# Patient Record
Sex: Male | Born: 1940 | Race: White | Hispanic: No | Smoking: Former smoker
Health system: Southern US, Community
[De-identification: ages and names within clinical notes are randomized; demographics above are authoritative.]

## PROBLEM LIST (undated history)

## (undated) DIAGNOSIS — E039 Hypothyroidism, unspecified: Secondary | ICD-10-CM

## (undated) DIAGNOSIS — Z955 Presence of coronary angioplasty implant and graft: Secondary | ICD-10-CM

## (undated) DIAGNOSIS — C801 Malignant (primary) neoplasm, unspecified: Secondary | ICD-10-CM

## (undated) HISTORY — PX: CATARACT EXTRACTION: SUR2

## (undated) HISTORY — PX: TONSILLECTOMY: SUR1361

## (undated) HISTORY — PX: BACK SURGERY: SHX140

## (undated) HISTORY — PX: PROSTATE SURGERY: SHX751

## (undated) HISTORY — PX: CARDIAC CATHETERIZATION: SHX172

---

## 2010-10-10 DIAGNOSIS — E785 Hyperlipidemia, unspecified: Secondary | ICD-10-CM | POA: Insufficient documentation

## 2010-10-22 DIAGNOSIS — Z86010 Personal history of colonic polyps: Secondary | ICD-10-CM | POA: Insufficient documentation

## 2010-10-22 DIAGNOSIS — Z87891 Personal history of nicotine dependence: Secondary | ICD-10-CM | POA: Insufficient documentation

## 2010-10-23 DIAGNOSIS — Z8546 Personal history of malignant neoplasm of prostate: Secondary | ICD-10-CM | POA: Insufficient documentation

## 2011-09-01 DIAGNOSIS — Z823 Family history of stroke: Secondary | ICD-10-CM | POA: Insufficient documentation

## 2011-10-27 DIAGNOSIS — Z9889 Other specified postprocedural states: Secondary | ICD-10-CM | POA: Insufficient documentation

## 2012-10-26 DIAGNOSIS — H9190 Unspecified hearing loss, unspecified ear: Secondary | ICD-10-CM | POA: Insufficient documentation

## 2012-10-26 DIAGNOSIS — M542 Cervicalgia: Secondary | ICD-10-CM | POA: Insufficient documentation

## 2012-11-05 DIAGNOSIS — H6980 Other specified disorders of Eustachian tube, unspecified ear: Secondary | ICD-10-CM | POA: Insufficient documentation

## 2012-12-08 DIAGNOSIS — L309 Dermatitis, unspecified: Secondary | ICD-10-CM | POA: Insufficient documentation

## 2013-01-04 DIAGNOSIS — J42 Unspecified chronic bronchitis: Secondary | ICD-10-CM | POA: Insufficient documentation

## 2013-08-29 DIAGNOSIS — C61 Malignant neoplasm of prostate: Secondary | ICD-10-CM | POA: Insufficient documentation

## 2013-08-31 DIAGNOSIS — I209 Angina pectoris, unspecified: Secondary | ICD-10-CM | POA: Insufficient documentation

## 2013-09-27 DIAGNOSIS — G4733 Obstructive sleep apnea (adult) (pediatric): Secondary | ICD-10-CM | POA: Insufficient documentation

## 2013-12-05 DIAGNOSIS — R011 Cardiac murmur, unspecified: Secondary | ICD-10-CM | POA: Insufficient documentation

## 2016-01-11 DIAGNOSIS — E785 Hyperlipidemia, unspecified: Secondary | ICD-10-CM | POA: Insufficient documentation

## 2017-09-08 DIAGNOSIS — F419 Anxiety disorder, unspecified: Secondary | ICD-10-CM | POA: Insufficient documentation

## 2017-09-08 DIAGNOSIS — K219 Gastro-esophageal reflux disease without esophagitis: Secondary | ICD-10-CM | POA: Insufficient documentation

## 2017-09-08 DIAGNOSIS — G459 Transient cerebral ischemic attack, unspecified: Secondary | ICD-10-CM | POA: Insufficient documentation

## 2017-10-22 DIAGNOSIS — R110 Nausea: Secondary | ICD-10-CM | POA: Insufficient documentation

## 2017-10-22 DIAGNOSIS — R0609 Other forms of dyspnea: Secondary | ICD-10-CM | POA: Insufficient documentation

## 2017-10-22 DIAGNOSIS — R002 Palpitations: Secondary | ICD-10-CM | POA: Insufficient documentation

## 2021-10-14 NOTE — Progress Notes (Signed)
Sent message, via epic in basket, requesting orders in epic from surgeon.  

## 2021-10-24 NOTE — Progress Notes (Addendum)
COVID swab appointment: n/a  COVID Vaccine Completed: yes x3 Date COVID Vaccine completed: Has received booster: COVID vaccine manufacturer: Pfizer      Date of COVID positive in last 90 days: no  PCP - Geralyn Corwin Cardiologist - Dr Gerome [facility]  Chest x-ray - n/a EKG - 09/26/21 on chart Stress Test - many years ago per pt ECHO -  Cardiac Cath - 20 years has stent Pacemaker/ICD device last checked: no Spinal Cord Stimulator:n/a  Sleep Study - yes, negative CPAP -   Fasting Blood Sugar - n/a Checks Blood Sugar _____ times a day  Blood Thinner Instructions: Plavix, 1 week Aspirin Instructions:ASA 81, do not stop per cardiologist Last Dose:  Activity level: Can go up a flight of stairs and perform activities of daily living without stopping and without symptoms of chest pain or shortness of breath.  Anesthesia review:   Patient denies shortness of breath, fever, cough and chest pain at PAT appointment   Patient verbalized understanding of instructions that were given to them at the PAT appointment. Patient was also instructed that they will need to review over the PAT instructions again at home before surgery.

## 2021-10-24 NOTE — Patient Instructions (Addendum)
DUE TO COVID-19 ONLY ONE VISITOR IS ALLOWED TO COME WITH YOU AND STAY IN THE WAITING ROOM ONLY DURING PRE OP AND PROCEDURE.   **NO VISITORS ARE ALLOWED IN THE SHORT STAY AREA OR RECOVERY ROOM!!**       Your procedure is scheduled on: 11/06/21   Report to [facility] Main Entrance    Report to admitting at 5:45 AM   Call this number if you have problems the morning of surgery [phone]   Do not eat food :After Midnight.   May have liquids until 5:30 AM day of surgery  CLEAR LIQUID DIET  Foods Allowed                                                                     Foods Excluded  Water, Black Coffee and tea (no milk or creamer)           liquids that you cannot  Plain Jell-O in any flavor  (No red)                                    see through such as: Fruit ices (not with fruit pulp)                                            milk, soups, orange juice              Iced Popsicles (No red)                                               All solid food                                   Apple juices Sports drinks like Gatorade (No red) Lightly seasoned clear broth or consume(fat free) Sugar     The day of surgery:  Drink ONE (1) Pre-Surgery clear ensure by 5:30 am the morning of surgery. Drink in one sitting. Do not sip.  This drink was given to you during your hospital  pre-op appointment visit. Nothing else to drink after completing the  Pre-Surgery Clear Ensure.          If you have questions, please contact your surgeon's office.     Oral Hygiene is also important to reduce your risk of infection.                                    Remember - BRUSH YOUR TEETH THE MORNING OF SURGERY WITH YOUR REGULAR TOOTHPASTE   Take these medicines the morning of surgery with A SIP OF WATER: Amlodipine, Synthroid, Omeprazole, Pravastatin   DO NOT TAKE ANY ORAL DIABETIC MEDICATIONS DAY OF YOUR SURGERY  You may not have any metal on your body  including jewelry, and body piercing             Do not wear lotions, powders, cologne, or deodorant              Men may shave face and neck.   Do not bring valuables to the hospital. [place].    Patients discharged on the day of surgery will not be allowed to drive home.   Special Instructions: Bring a copy of your healthcare power of attorney and living will documents         the day of surgery if you haven't scanned them before.              Please read over the following fact sheets you were given: IF YOU HAVE QUESTIONS ABOUT YOUR PRE-OP INSTRUCTIONS PLEASE CALL [facility] - Preparing for Surgery Before surgery, you can play an important role.  Because skin is not sterile, your skin needs to be as free of germs as possible.  You can reduce the number of germs on your skin by washing with CHG (chlorahexidine gluconate) soap before surgery.  CHG is an antiseptic cleaner which kills germs and bonds with the skin to continue killing germs even after washing. Please DO NOT use if you have an allergy to CHG or antibacterial soaps.  If your skin becomes reddened/irritated stop using the CHG and inform your nurse when you arrive at Short Stay. Do not shave (including legs and underarms) for at least 48 hours prior to the first CHG shower.  You may shave your face/neck.  Please follow these instructions carefully:  1.  Shower with CHG Soap the night before surgery and the  morning of surgery.  2.  If you choose to wash your hair, wash your hair first as usual with your normal  shampoo.  3.  After you shampoo, rinse your hair and body thoroughly to remove the shampoo.                             4.  Use CHG as you would any other liquid soap.  You can apply chg directly to the skin and wash.  Gently with a scrungie or clean washcloth.  5.  Apply the CHG Soap to your body ONLY FROM THE NECK DOWN.   Do   not use on face/  open                           Wound or open sores. Avoid contact with eyes, ears mouth and   genitals (private parts).                       Wash face,  Genitals (private parts) with your normal soap.             6.  Wash thoroughly, paying special attention to the area where your    surgery  will be performed.  7.  Thoroughly rinse your body with warm water from the neck down.  8.  DO NOT shower/wash with your normal soap after using and rinsing off the CHG Soap.  9.  Pat yourself dry with a clean towel.            10.  Wear clean pajamas.            11.  Place clean sheets on your bed the night of your first shower and do not  sleep with pets. Day of Surgery : Do not apply any lotions/deodorants the morning of surgery.  Please wear clean clothes to the hospital/surgery center.  FAILURE TO FOLLOW THESE INSTRUCTIONS MAY RESULT IN THE CANCELLATION OF YOUR SURGERY  PATIENT SIGNATURE_________________________________  NURSE SIGNATURE__________________________________  ________________________________________________________________________   Robert Horne  An incentive spirometer is a tool that can help keep your lungs clear and active. This tool measures how well you are filling your lungs with each breath. Taking long deep breaths may help reverse or decrease the chance of developing breathing (pulmonary) problems (especially infection) following: A long period of time when you are unable to move or be active. BEFORE THE PROCEDURE  If the spirometer includes an indicator to show your best effort, your nurse or respiratory therapist will set it to a desired goal. If possible, sit up straight or lean slightly forward. Try not to slouch. Hold the incentive spirometer in an upright position. INSTRUCTIONS FOR USE  Sit on the edge of your bed if possible, or sit up as far as you can in bed or on a chair. Hold the incentive spirometer in an upright position. Breathe out  normally. Place the mouthpiece in your mouth and seal your lips tightly around it. Breathe in slowly and as deeply as possible, raising the piston or the ball toward the top of the column. Hold your breath for 3-5 seconds or for as long as possible. Allow the piston or ball to fall to the bottom of the column. Remove the mouthpiece from your mouth and breathe out normally. Rest for a few seconds and repeat Steps 1 through 7 at least 10 times every 1-2 hours when you are awake. Take your time and take a few normal breaths between deep breaths. The spirometer may include an indicator to show your best effort. Use the indicator as a goal to work toward during each repetition. After each set of 10 deep breaths, practice coughing to be sure your lungs are clear. If you have an incision (the cut made at the time of surgery), support your incision when coughing by placing a pillow or rolled up towels firmly against it. Once you are able to get out of bed, walk around indoors and cough well. You may stop using the incentive spirometer when instructed by your caregiver.  RISKS AND COMPLICATIONS Take your time so you do not get dizzy or light-headed. If you are in pain, you may need to take or ask for pain medication before doing incentive spirometry. It is harder to take a deep breath if you are having pain. AFTER USE Rest and breathe slowly and easily. It can be helpful to keep track of a log of your progress. Your caregiver can provide you with a simple table to help with this. If you are using the spirometer at home, follow these instructions: [place] IF:  You are having difficultly using the spirometer. You have trouble using the spirometer as often as instructed. Your pain medication is not giving enough relief while using the spirometer. You develop fever of 100.5 F (38.1 C) or higher. SEEK IMMEDIATE MEDICAL CARE IF:  You cough up bloody sputum that had  not been present before. You  develop fever of 102 F (38.9 C) or greater. You develop worsening pain at or near the incision site. MAKE SURE YOU:  Understand these instructions. Will watch your condition. Will get help right away if you are not doing well or get worse. Document Released: 03/23/2007 Document Revised: 02/02/2012 Document Reviewed: 05/24/2007 ExitCare Patient Information 2014 [place].   ________________________________________________________________________  [facility]- Preparing for Total Shoulder Arthroplasty    Before surgery, you can play an important role. Because skin is not sterile, your skin needs to be as free of germs as possible. You can reduce the number of germs on your skin by using the following products. Benzoyl Peroxide Gel Reduces the number of germs present on the skin Applied twice a day to shoulder area starting two days before surgery    ==================================================================  Please follow these instructions carefully:  BENZOYL PEROXIDE 5% GEL  Please do not use if you have an allergy to benzoyl peroxide.   If your skin becomes reddened/irritated stop using the benzoyl peroxide.  Starting two days before surgery, apply as follows: Apply benzoyl peroxide in the morning and at night. Apply after taking a shower. If you are not taking a shower clean entire shoulder front, back, and side along with the armpit with a clean wet washcloth.  Place a quarter-sized dollop on your shoulder and rub in thoroughly, making sure to cover the front, back, and side of your shoulder, along with the armpit.   2 days before ____ AM   ____ PM              1 day before ____ AM   ____ PM                         Do this twice a day for two days.  (Last application is the night before surgery, AFTER using the CHG soap as described below).  Do NOT apply benzoyl peroxide gel on the day of surgery.

## 2021-10-28 ENCOUNTER — Encounter (HOSPITAL_COMMUNITY): Payer: Self-pay

## 2021-10-28 ENCOUNTER — Encounter (HOSPITAL_COMMUNITY)
Admission: RE | Admit: 2021-10-28 | Discharge: 2021-10-28 | Disposition: A | Discharge status: Home / Self Care | Payer: Medicare Other | Source: Ambulatory Visit | Attending: Orthopaedic Surgery | Admitting: Orthopaedic Surgery

## 2021-10-28 ENCOUNTER — Other Ambulatory Visit: Payer: Self-pay

## 2021-10-28 VITALS — BP 133/66 | HR 85 | Temp 97.8°F | Resp 20 | Ht 71.0 in

## 2021-10-28 DIAGNOSIS — Z01812 Encounter for preprocedural laboratory examination: Secondary | ICD-10-CM | POA: Diagnosis present

## 2021-10-28 DIAGNOSIS — Z01818 Encounter for other preprocedural examination: Secondary | ICD-10-CM

## 2021-10-28 DIAGNOSIS — I2510 Atherosclerotic heart disease of native coronary artery without angina pectoris: Secondary | ICD-10-CM | POA: Insufficient documentation

## 2021-10-28 HISTORY — DX: Presence of coronary angioplasty implant and graft: Z95.5

## 2021-10-28 HISTORY — DX: Hypothyroidism, unspecified: E03.9

## 2021-10-28 HISTORY — DX: Malignant (primary) neoplasm, unspecified: C80.1

## 2021-10-28 LAB — BASIC METABOLIC PANEL
Anion gap: 6 (ref 5–15)
BUN: 13 mg/dL (ref 8–23)
CO2: 26 mmol/L (ref 22–32)
Calcium: 9.2 mg/dL (ref 8.9–10.3)
Chloride: 107 mmol/L (ref 98–111)
Creatinine, Ser: 0.89 mg/dL (ref 0.61–1.24)
GFR, Estimated: >60 mL/min (ref >60)
Glucose, Bld: 109 mg/dL — ABNORMAL HIGH (ref 70–99)
Potassium: 4.4 mmol/L (ref 3.5–5.1)
Sodium: 139 mmol/L (ref 135–145)

## 2021-10-28 LAB — CBC
HCT: 47.8 % (ref 39.0–52.0)
Hemoglobin: 15.7 g/dL (ref 13.0–17.0)
MCH: 30.8 pg (ref 26.0–34.0)
MCHC: 32.8 g/dL (ref 30.0–36.0)
MCV: 93.9 fL (ref 80.0–100.0)
Platelets: 282 10*3/uL (ref 150–400)
RBC: 5.09 MIL/uL (ref 4.22–5.81)
RDW: 12.2 % (ref 11.5–15.5)
WBC: 9.5 10*3/uL (ref 4.0–10.5)
nRBC: 0 % (ref 0.0–0.2)

## 2021-10-28 LAB — SURGICAL PCR SCREEN
MRSA, PCR: NEGATIVE
Staphylococcus aureus: NEGATIVE

## 2021-11-05 ENCOUNTER — Encounter (HOSPITAL_COMMUNITY): Payer: Self-pay | Admitting: Orthopaedic Surgery

## 2021-11-05 NOTE — H&P (Signed)
PREOPERATIVE H&P  Chief Complaint: djd right shoulder  HPI: Robert Horne is a 80 y.o. male who is scheduled for Procedure(s): [place].   Patient has a past medical history significant for prostate cancer in 2002, hypothyroidism, coronary artery stent.   Patient is an 80 year-old who previously had no reported history of pain in the shoulder who had an injury four weeks ago.  He picked up something and heard a pop.  He was seen by an orthopedist in [place]. He is right hand dominant at baseline.  He has not been able to raise his arm.  He has been trying non-operative measures, but has not made much progress.  He has a history of prostate cancer and is on Plavix and 81 mg Aspirin.    His symptoms are rated as moderate to severe, and have been worsening.  This is significantly impairing activities of daily living.    Please see clinic note for further details on this patient's care.    He has elected for surgical management.   Past Medical History:  Diagnosis Date   Cancer [facility])    prostate, 2002   H/O heart artery stent    Hypothyroidism    Past Surgical History:  Procedure Laterality Date   BACK SURGERY     CARDIAC CATHETERIZATION     CATARACT EXTRACTION Bilateral    PROSTATE SURGERY     TONSILLECTOMY     Social History   Socioeconomic History   Marital status: Married    Spouse name: Not on file   Number of children: Not on file   Years of education: Not on file   Highest education level: Not on file  Occupational History   Not on file  Tobacco Use   Smoking status: Former    Types: Cigarettes    Quit date: 10/24/1970    Years since quitting: 51.0   Smokeless tobacco: Never  Vaping Use   Vaping Use: Never used  Substance and Sexual Activity   Alcohol use: Never   Drug use: Never   Sexual activity: Not on file  Other Topics Concern   Not on file  Social History Narrative   Not on file   Social Determinants of Health   Financial  Resource Strain: Not on file  Food Insecurity: Not on file  Transportation Needs: Not on file  Physical Activity: Not on file  Stress: Not on file  Social Connections: Not on file   No family history on file. No Known Allergies Prior to Admission medications   Medication Sig Start Date End Date Taking? Authorizing Provider  amLODipine (NORVASC) 2.5 MG tablet Take 2.5 mg by mouth daily.   Yes [provider]  aspirin EC 81 MG tablet Take 81 mg by mouth daily. Swallow whole.   Yes [provider]  Cholecalciferol (VITAMIN D) 50 MCG (2000 UT) tablet Take 2,000 Units by mouth daily.   Yes [provider]  clopidogrel (PLAVIX) 75 MG tablet Take 75 mg by mouth daily.   Yes [provider]  levothyroxine (SYNTHROID) 50 MCG tablet Take 50 mcg by mouth daily before breakfast.   Yes [provider]  mirtazapine (REMERON) 45 MG tablet Take 45 mg by mouth at bedtime.   Yes [provider]  omeprazole (PRILOSEC) 20 MG capsule Take 20 mg by mouth daily.   Yes [provider]  pravastatin (PRAVACHOL) 10 MG tablet Take 10 mg by mouth daily.   Yes [provider]  thyroid (ARMOUR) 60 MG tablet Take 60 mg by mouth daily before breakfast.   Yes [provider]  triamcinolone cream (KENALOG) 0.1 % Apply 1 application topically 2 (two) times daily as needed (eczema).   Yes [provider]    ROS: All other systems have been reviewed and were otherwise negative with the exception of those mentioned in the HPI and as above.  Physical Exam: General: Alert, no acute distress Cardiovascular: No pedal edema Respiratory: No cyanosis, no use of accessory musculature GI: No organomegaly, abdomen is soft and non-tender Skin: No lesions in the area of chief complaint Neurologic: Sensation intact distally Psychiatric: Patient is competent for consent with normal mood and affect Lymphatic: No axillary or cervical  lymphadenopathy  MUSCULOSKELETAL:  Right shoulder: Active forward elevation about 20 degrees.  He has pseudo paralysis.  He does not really have any functional shoulder testing due to his obvious pain.  Passive motion to 170 degrees.    Imaging: MRI demonstrates a massive cuff tear, three tendon.  Assessment: djd right shoulder  Plan: Plan for Procedure(s): REVERSE SHOULDER ARTHROPLASTY  The risks benefits and alternatives were discussed with the patient including but not limited to the risks of nonoperative treatment, versus surgical intervention including infection, bleeding, nerve injury,  blood clots, cardiopulmonary complications, morbidity, mortality, among others, and they were willing to proceed.   We additionally specifically discussed risks of axillary nerve injury, infection, periprosthetic fracture, continued pain and longevity of implants prior to beginning procedure.    Patient will be closely monitored in PACU for medical stabilization and pain control. If found stable in PACU, patient may be discharged home with outpatient follow-up. If any concerns regarding patient's stabilization patient will be admitted for observation after surgery. The patient is planning to be discharged home with outpatient PT.   The patient acknowledged the explanation, agreed to proceed with the plan and consent was signed.   He received operative clearance from his PCP, Dr. Alfonse Ras, and cardiologist, Carrington Clamp PA.  Operative Plan: Right reverse total shoulder arthroplasty Discharge Medications: Tylenol, oxycodone, zofran DVT Prophylaxis: resume plavix and aspirin Physical Therapy: outpatient PT ([facility]) Special Discharge needs: [place]. [place], PA-C  11/05/2021 7:03 AM

## 2021-11-05 NOTE — Anesthesia Preprocedure Evaluation (Addendum)
Anesthesia Evaluation  Patient identified by MRN, date of birth, ID band Patient awake    Reviewed: Allergy & Precautions, NPO status , Patient's Chart, lab work & pertinent test results, reviewed documented beta blocker date and time   Airway Mallampati: III  TM Distance: >3 FB Neck ROM: Full    Dental no notable dental hx. (+) Teeth Intact, Dental Advisory Given   Pulmonary former smoker,    Pulmonary exam normal breath sounds clear to auscultation       Cardiovascular Exercise Tolerance: Good + CAD and + Cardiac Stents  Normal cardiovascular exam Rhythm:Regular Rate:Normal  Hx/o stent placement 20 years ago, asymptomatic since that time   Neuro/Psych negative neurological ROS  negative psych ROS   GI/Hepatic negative GI ROS, Neg liver ROS,   Endo/Other  Hypothyroidism Hyperlipidemia  Renal/GU negative Renal ROS   Hx/o prostate Ca S/P radioactive seed placement 20 years ago negative genitourinary   Musculoskeletal  (+) Arthritis , Osteoarthritis,  DJD right shoulder   Abdominal   Peds  Hematology Plavix therapy- last dose 1 week ago   Anesthesia Other Findings   Reproductive/Obstetrics                           Anesthesia Physical Anesthesia Plan  ASA: 3  Anesthesia Plan: General   Post-op Pain Management: Regional block   Induction: Intravenous  PONV Risk Score and Plan: 3 and Treatment may vary due to age or medical condition, Ondansetron and Dexamethasone  Airway Management Planned: Oral ETT  Additional Equipment:   Intra-op Plan:   Post-operative Plan: Extubation in OR  Informed Consent: I have reviewed the patients History and Physical, chart, labs and discussed the procedure including the risks, benefits and alternatives for the proposed anesthesia with the patient or authorized representative who has indicated his/her understanding and acceptance.     Dental  advisory given  Plan Discussed with: CRNA and Anesthesiologist  Anesthesia Plan Comments:        Anesthesia Quick Evaluation

## 2021-11-06 ENCOUNTER — Ambulatory Visit (HOSPITAL_COMMUNITY): Payer: Medicare Other

## 2021-11-06 ENCOUNTER — Encounter (HOSPITAL_COMMUNITY)
Admission: RE | Disposition: A | Discharge status: Home / Self Care | Payer: Self-pay | Source: Home / Self Care | Attending: Orthopaedic Surgery

## 2021-11-06 ENCOUNTER — Ambulatory Visit (HOSPITAL_COMMUNITY): Payer: Medicare Other | Admitting: Anesthesiology

## 2021-11-06 ENCOUNTER — Ambulatory Visit (HOSPITAL_COMMUNITY)
Admission: RE | Admit: 2021-11-06 | Discharge: 2021-11-06 | Disposition: A | Discharge status: Home / Self Care | Payer: Medicare Other | Attending: Orthopaedic Surgery | Admitting: Orthopaedic Surgery

## 2021-11-06 ENCOUNTER — Encounter (HOSPITAL_COMMUNITY): Payer: Self-pay | Admitting: Orthopaedic Surgery

## 2021-11-06 DIAGNOSIS — Z87891 Personal history of nicotine dependence: Secondary | ICD-10-CM | POA: Insufficient documentation

## 2021-11-06 DIAGNOSIS — Z955 Presence of coronary angioplasty implant and graft: Secondary | ICD-10-CM | POA: Diagnosis not present

## 2021-11-06 DIAGNOSIS — Z09 Encounter for follow-up examination after completed treatment for conditions other than malignant neoplasm: Secondary | ICD-10-CM

## 2021-11-06 DIAGNOSIS — M25711 Osteophyte, right shoulder: Secondary | ICD-10-CM | POA: Diagnosis not present

## 2021-11-06 DIAGNOSIS — M75101 Unspecified rotator cuff tear or rupture of right shoulder, not specified as traumatic: Secondary | ICD-10-CM | POA: Diagnosis not present

## 2021-11-06 DIAGNOSIS — Z7982 Long term (current) use of aspirin: Secondary | ICD-10-CM | POA: Insufficient documentation

## 2021-11-06 DIAGNOSIS — E785 Hyperlipidemia, unspecified: Secondary | ICD-10-CM | POA: Insufficient documentation

## 2021-11-06 DIAGNOSIS — I2510 Atherosclerotic heart disease of native coronary artery without angina pectoris: Secondary | ICD-10-CM | POA: Insufficient documentation

## 2021-11-06 DIAGNOSIS — E039 Hypothyroidism, unspecified: Secondary | ICD-10-CM | POA: Insufficient documentation

## 2021-11-06 DIAGNOSIS — Z8546 Personal history of malignant neoplasm of prostate: Secondary | ICD-10-CM | POA: Diagnosis not present

## 2021-11-06 DIAGNOSIS — M19011 Primary osteoarthritis, right shoulder: Secondary | ICD-10-CM | POA: Insufficient documentation

## 2021-11-06 HISTORY — PX: REVERSE SHOULDER ARTHROPLASTY: SHX5054

## 2021-11-06 SURGERY — ARTHROPLASTY, SHOULDER, TOTAL, REVERSE
Anesthesia: General | Site: Shoulder | Laterality: Right

## 2021-11-06 MED ORDER — DEXAMETHASONE SODIUM PHOSPHATE 10 MG/ML IJ SOLN
INTRAMUSCULAR | Status: DC | PRN
  Administered 2021-11-06: 8 mg via INTRAVENOUS

## 2021-11-06 MED ORDER — LACTATED RINGERS IV SOLN
INTRAVENOUS | Status: DC
  Administered 2021-11-06: 11:00:00 1000 mL via INTRAVENOUS

## 2021-11-06 MED ORDER — PROPOFOL 10 MG/ML IV BOLUS
INTRAVENOUS | Status: DC | PRN
  Administered 2021-11-06: 110 mg via INTRAVENOUS

## 2021-11-06 MED ORDER — OXYCODONE HCL 5 MG PO TABS: 5.0000 mg | ORAL_TABLET | Freq: Once | ORAL | Status: DC | PRN

## 2021-11-06 MED ORDER — ROCURONIUM BROMIDE 10 MG/ML (PF) SYRINGE
PREFILLED_SYRINGE | INTRAVENOUS | Status: DC | PRN
  Administered 2021-11-06: 60 mg via INTRAVENOUS

## 2021-11-06 MED ORDER — VANCOMYCIN HCL 1000 MG IV SOLR
INTRAVENOUS | Status: AC
  Filled 2021-11-06: qty 20

## 2021-11-06 MED ORDER — EPHEDRINE 5 MG/ML INJ
INTRAVENOUS | Status: AC
  Filled 2021-11-06: qty 5

## 2021-11-06 MED ORDER — ONDANSETRON HCL 4 MG PO TABS: 4.0000 mg | ORAL_TABLET | Freq: Three times a day (TID) | ORAL | 0 refills | Status: DC | PRN

## 2021-11-06 MED ORDER — BUPIVACAINE LIPOSOME 1.3 % IJ SUSP
INTRAMUSCULAR | Status: DC | PRN
  Administered 2021-11-06: 10 mL via PERINEURAL

## 2021-11-06 MED ORDER — PROPOFOL 10 MG/ML IV BOLUS
INTRAVENOUS | Status: AC
  Filled 2021-11-06: qty 20

## 2021-11-06 MED ORDER — FENTANYL CITRATE (PF) 100 MCG/2ML IJ SOLN
INTRAMUSCULAR | Status: AC
  Filled 2021-11-06: qty 2

## 2021-11-06 MED ORDER — PHENYLEPHRINE HCL (PRESSORS) 10 MG/ML IV SOLN
INTRAVENOUS | Status: AC
  Filled 2021-11-06: qty 1

## 2021-11-06 MED ORDER — ORAL CARE MOUTH RINSE
15.0000 mL | Freq: Once | OROMUCOSAL | Status: AC
  Administered 2021-11-06: 07:00:00 15 mL via OROMUCOSAL

## 2021-11-06 MED ORDER — EPHEDRINE SULFATE-NACL 50-0.9 MG/10ML-% IV SOSY
PREFILLED_SYRINGE | INTRAVENOUS | Status: DC | PRN
  Administered 2021-11-06: 5 mg via INTRAVENOUS

## 2021-11-06 MED ORDER — LIDOCAINE 2% (20 MG/ML) 5 ML SYRINGE
INTRAMUSCULAR | Status: DC | PRN
  Administered 2021-11-06: 60 mg via INTRAVENOUS

## 2021-11-06 MED ORDER — 0.9 % SODIUM CHLORIDE (POUR BTL) OPTIME
TOPICAL | Status: DC | PRN
  Administered 2021-11-06: 09:00:00 1000 mL

## 2021-11-06 MED ORDER — OXYCODONE HCL 5 MG PO TABS: ORAL_TABLET | ORAL | 0 refills | Status: AC

## 2021-11-06 MED ORDER — FENTANYL CITRATE (PF) 100 MCG/2ML IJ SOLN
INTRAMUSCULAR | Status: DC | PRN
  Administered 2021-11-06 (×2): 50 ug via INTRAVENOUS

## 2021-11-06 MED ORDER — DEXAMETHASONE SODIUM PHOSPHATE 10 MG/ML IJ SOLN
INTRAMUSCULAR | Status: AC
  Filled 2021-11-06: qty 1

## 2021-11-06 MED ORDER — CEFAZOLIN SODIUM-DEXTROSE 2-4 GM/100ML-% IV SOLN
2.0000 g | INTRAVENOUS | Status: AC
  Administered 2021-11-06: 09:00:00 2 g via INTRAVENOUS

## 2021-11-06 MED ORDER — ACETAMINOPHEN 500 MG PO TABS: 1000.0000 mg | ORAL_TABLET | Freq: Three times a day (TID) | ORAL | 0 refills | Status: DC

## 2021-11-06 MED ORDER — ONDANSETRON HCL 4 MG/2ML IJ SOLN: 4.0000 mg | Freq: Once | INTRAMUSCULAR | Status: DC | PRN

## 2021-11-06 MED ORDER — CHLORHEXIDINE GLUCONATE 0.12 % MT SOLN: 15.0000 mL | Freq: Once | OROMUCOSAL | Status: AC

## 2021-11-06 MED ORDER — LACTATED RINGERS IV BOLUS
500.0000 mL | Freq: Once | INTRAVENOUS | Status: AC
  Administered 2021-11-06: 10:00:00 500 mL via INTRAVENOUS

## 2021-11-06 MED ORDER — ACETAMINOPHEN 500 MG PO TABS
1000.0000 mg | ORAL_TABLET | Freq: Once | ORAL | Status: AC
  Administered 2021-11-06: 07:00:00 1000 mg via ORAL
  Filled 2021-11-06: qty 2

## 2021-11-06 MED ORDER — OXYCODONE HCL 5 MG/5ML PO SOLN: 5.0000 mg | Freq: Once | ORAL | Status: DC | PRN

## 2021-11-06 MED ORDER — LACTATED RINGERS IV BOLUS
250.0000 mL | Freq: Once | INTRAVENOUS | Status: AC
  Administered 2021-11-06: 11:00:00 250 mL via INTRAVENOUS

## 2021-11-06 MED ORDER — ONDANSETRON HCL 4 MG PO TABS: 4.0000 mg | ORAL_TABLET | Freq: Three times a day (TID) | ORAL | 0 refills | Status: AC | PRN

## 2021-11-06 MED ORDER — SODIUM CHLORIDE 0.9 % IR SOLN
Status: DC | PRN
  Administered 2021-11-06: 1000 mL

## 2021-11-06 MED ORDER — OXYCODONE HCL 5 MG PO TABS: ORAL_TABLET | ORAL | 0 refills | Status: DC

## 2021-11-06 MED ORDER — SUGAMMADEX SODIUM 200 MG/2ML IV SOLN
INTRAVENOUS | Status: DC | PRN
  Administered 2021-11-06: 200 mg via INTRAVENOUS

## 2021-11-06 MED ORDER — STERILE WATER FOR IRRIGATION IR SOLN
Status: DC | PRN
  Administered 2021-11-06: 2000 mL

## 2021-11-06 MED ORDER — ONDANSETRON HCL 4 MG/2ML IJ SOLN
INTRAMUSCULAR | Status: DC | PRN
  Administered 2021-11-06: 4 mg via INTRAVENOUS

## 2021-11-06 MED ORDER — PHENYLEPHRINE HCL-NACL 20-0.9 MG/250ML-% IV SOLN
INTRAVENOUS | Status: DC | PRN
  Administered 2021-11-06: 25 ug/min via INTRAVENOUS

## 2021-11-06 MED ORDER — VANCOMYCIN HCL 1 G IV SOLR
INTRAVENOUS | Status: DC | PRN
  Administered 2021-11-06: 1000 mg

## 2021-11-06 MED ORDER — FENTANYL CITRATE PF 50 MCG/ML IJ SOSY: 25.0000 ug | PREFILLED_SYRINGE | INTRAMUSCULAR | Status: DC | PRN

## 2021-11-06 MED ORDER — ACETAMINOPHEN 500 MG PO TABS: 1000.0000 mg | ORAL_TABLET | Freq: Three times a day (TID) | ORAL | 0 refills | Status: AC

## 2021-11-06 MED ORDER — BUPIVACAINE HCL (PF) 0.5 % IJ SOLN
INTRAMUSCULAR | Status: DC | PRN
  Administered 2021-11-06: 20 mL via PERINEURAL

## 2021-11-06 MED ORDER — ONDANSETRON HCL 4 MG/2ML IJ SOLN
INTRAMUSCULAR | Status: AC
  Filled 2021-11-06: qty 2

## 2021-11-06 MED ORDER — MIDAZOLAM HCL 2 MG/2ML IJ SOLN
INTRAMUSCULAR | Status: AC
  Filled 2021-11-06: qty 2

## 2021-11-06 MED ORDER — PHENYLEPHRINE 40 MCG/ML (10ML) SYRINGE FOR IV PUSH (FOR BLOOD PRESSURE SUPPORT)
PREFILLED_SYRINGE | INTRAVENOUS | Status: DC | PRN
  Administered 2021-11-06 (×2): 120 ug via INTRAVENOUS

## 2021-11-06 MED ORDER — CEFAZOLIN SODIUM-DEXTROSE 2-4 GM/100ML-% IV SOLN
INTRAVENOUS | Status: AC
  Filled 2021-11-06: qty 100

## 2021-11-06 MED ORDER — LIDOCAINE HCL (PF) 2 % IJ SOLN
INTRAMUSCULAR | Status: AC
  Filled 2021-11-06: qty 5

## 2021-11-06 MED ORDER — TRANEXAMIC ACID-NACL 1000-0.7 MG/100ML-% IV SOLN
1000.0000 mg | INTRAVENOUS | Status: AC
  Administered 2021-11-06: 09:00:00 1000 mg via INTRAVENOUS
  Filled 2021-11-06: qty 100

## 2021-11-06 SURGICAL SUPPLY — 67 items
BAG COUNTER SPONGE SURGICOUNT (BAG) ×2 IMPLANT
BIT DRILL 3.2 PERIPHERAL SCREW (BIT) ×1 IMPLANT
BLADE SAW SAG 73X25 THK (BLADE) ×1
BLADE SAW SGTL 73X25 THK (BLADE) ×1 IMPLANT
CHLORAPREP W/TINT 26 (MISCELLANEOUS) ×4 IMPLANT
CLSR STERI-STRIP ANTIMIC 1/2X4 (GAUZE/BANDAGES/DRESSINGS) ×2 IMPLANT
COOLER ICEMAN CLASSIC (MISCELLANEOUS) ×1 IMPLANT
COVER BACK TABLE 60X90IN (DRAPES) IMPLANT
COVER SURGICAL LIGHT HANDLE (MISCELLANEOUS) ×2 IMPLANT
DRAPE C-ARM 42X120 X-RAY (DRAPES) IMPLANT
DRAPE INCISE IOBAN 66X45 STRL (DRAPES) ×2 IMPLANT
DRAPE ORTHO SPLIT 77X108 STRL (DRAPES) ×2
DRAPE SHEET LG 3/4 BI-LAMINATE (DRAPES) ×4 IMPLANT
DRAPE SURG ORHT 6 SPLT 77X108 (DRAPES) ×2 IMPLANT
DRSG AQUACEL AG ADV 3.5X 6 (GAUZE/BANDAGES/DRESSINGS) ×2 IMPLANT
DRSG MEPILEX BORDER 4X8 (GAUZE/BANDAGES/DRESSINGS) ×1 IMPLANT
ELECT BLADE TIP CTD 4 INCH (ELECTRODE) ×2 IMPLANT
ELECT REM PT RETURN 15FT ADLT (MISCELLANEOUS) ×2 IMPLANT
FACESHIELD WRAPAROUND (MASK) ×2 IMPLANT
FACESHIELD WRAPAROUND OR TEAM (MASK) ×1 IMPLANT
GLENOID BASEPLATE 29 +3 (Joint) ×1 IMPLANT
GLENOSPHERE PERFORM 39 3 (Shoulder) ×1 IMPLANT
GLOVE SRG 8 PF TXTR STRL LF DI (GLOVE) ×1 IMPLANT
GLOVE SURG ENC MOIS LTX SZ6.5 (GLOVE) ×4 IMPLANT
GLOVE SURG NEOPR MICRO LF SZ8 (GLOVE) ×4 IMPLANT
GLOVE SURG UNDER POLY LF SZ6.5 (GLOVE) ×2 IMPLANT
GLOVE SURG UNDER POLY LF SZ8 (GLOVE) ×1
GOWN STRL REUS W/TWL LRG LVL3 (GOWN DISPOSABLE) ×2 IMPLANT
GOWN STRL REUS W/TWL XL LVL3 (GOWN DISPOSABLE) ×2 IMPLANT
GUIDE PIN 3X75 SHOULDER (PIN) ×2
GUIDEWIRE GLENOID 2.5X220 (WIRE) ×1 IMPLANT
HANDPIECE INTERPULSE COAX TIP (DISPOSABLE) ×1
HEMOSTAT SURGICEL 2X14 (HEMOSTASIS) IMPLANT
INSERT HUM PERF 3/4 39 +0 (Insert) ×1 IMPLANT
KIT BASIN OR (CUSTOM PROCEDURE TRAY) ×2 IMPLANT
KIT STABILIZATION SHOULDER (MISCELLANEOUS) ×2 IMPLANT
KIT TURNOVER KIT A (KITS) IMPLANT
MANIFOLD NEPTUNE II (INSTRUMENTS) ×2 IMPLANT
NDL MAYO CATGUT SZ4 TPR NDL (NEEDLE) IMPLANT
NEEDLE MAYO CATGUT SZ4 (NEEDLE) IMPLANT
NS IRRIG 1000ML POUR BTL (IV SOLUTION) ×2 IMPLANT
PACK SHOULDER (CUSTOM PROCEDURE TRAY) ×2 IMPLANT
PAD COLD SHLDR WRAP-ON (PAD) ×1 IMPLANT
PIN GUIDE 3X75 SHOULDER (PIN) IMPLANT
RESTRAINT HEAD UNIVERSAL NS (MISCELLANEOUS) ×2 IMPLANT
SCREW 5.5X26 (Screw) ×1 IMPLANT
SCREW CENTRAL THREAD 6.5X45 (Screw) ×1 IMPLANT
SCREW PERIPHERAL 42 (Screw) ×1 IMPLANT
SET HNDPC FAN SPRY TIP SCT (DISPOSABLE) ×1 IMPLANT
SLING ULTRA II L (ORTHOPEDIC SUPPLIES) IMPLANT
SLING ULTRA III MED (ORTHOPEDIC SUPPLIES) ×2 IMPLANT
SPHERE GLENOD +3X39XLATERALIZE (Shoulder) IMPLANT
SPHR GLND +3X39XLATERALIZE (Shoulder) ×1 IMPLANT
STEM HUMERAL STD SHORT SZ3 (Joint) ×1 IMPLANT
SUCTION FRAZIER HANDLE 12FR (TUBING) ×1
SUCTION TUBE FRAZIER 12FR DISP (TUBING) ×1 IMPLANT
SUT ETHIBOND 2 V 37 (SUTURE) ×2 IMPLANT
SUT ETHIBOND NAB CT1 #1 30IN (SUTURE) ×2 IMPLANT
SUT FIBERWIRE #5 38 CONV NDL (SUTURE)
SUT MNCRL AB 4-0 PS2 18 (SUTURE) ×2 IMPLANT
SUT VIC AB 0 CT1 36 (SUTURE) IMPLANT
SUT VIC AB 3-0 SH 27 (SUTURE) ×1
SUT VIC AB 3-0 SH 27X BRD (SUTURE) ×1 IMPLANT
SUTURE FIBERWR #5 38 CONV NDL (SUTURE) IMPLANT
TOWEL OR 17X26 10 PK STRL BLUE (TOWEL DISPOSABLE) ×2 IMPLANT
TUBE SUCTION HIGH CAP CLEAR NV (SUCTIONS) ×2 IMPLANT
WATER STERILE IRR 1000ML POUR (IV SOLUTION) ×4 IMPLANT

## 2021-11-06 NOTE — Discharge Instructions (Signed)
Ophelia Charter MD, MPH Noemi Chapel, PA-C [place] [address] [phone] (tel)   [phone] (fax)   [place] may leave the operative dressing in place until your follow-up appointment. KEEP THE INCISIONS CLEAN AND DRY. There may be a small amount of fluid/bleeding leaking at the surgical site. This is normal after surgery.  If it fills with liquid or blood please call us immediately to change it for you. Use the provided ice machine or Ice packs as often as possible for the first 3-4 days, then as needed for pain relief.   Keep a layer of cloth or a shirt between your skin and the cooling unit to prevent frost bite as it can get very cold.  SHOWERING: - You may shower on Post-Op Day #2.  - The dressing is water resistant but do not scrub it as it may start to peel up.   - You may remove the sling for showering, but keep a water resistant pillow under the arm to keep both the  elbow and shoulder away from the body (mimicking the abduction sling).  - Gently pat the area dry.  - Do not soak the shoulder in water. Do not go swimming in the pool or ocean until your incision has completely healed (about 4-6 weeks after surgery) - KEEP THE INCISIONS CLEAN AND DRY.  EXERCISES Wear the sling at all times You may remove the sling for showering, but keep the arm across the chest or in a secondary sling.    Accidental/Purposeful External Rotation and shoulder flexion (reaching behind you) is to be avoided at all costs for the first month. It is ok to come out of your sling if your are sitting and have assistance for eating.   Do not lift anything heavier than 1 pound until we discuss it further in clinic.   REGIONAL ANESTHESIA (NERVE BLOCKS) The anesthesia team may have performed a nerve block for you if safe in the setting of your care.  This is a great tool used to minimize pain.   Typically the block may start wearing off overnight but the long acting medicine may last for 3-4 days.  The nerve block wearing off can be a challenging period but please utilize your as needed pain medications to try and manage this period.    POST-OP MEDICATIONS- Multimodal approach to pain control In general your pain will be controlled with a combination of substances.  Prescriptions unless otherwise discussed are electronically sent to your pharmacy.  This is a carefully made plan we use to minimize narcotic use.     Acetaminophen - Non-narcotic pain medicine taken on a scheduled basis  Oxycodone - This is a strong narcotic, to be used only on an as needed basis for SEVERE pain. Zofran -  take as needed for nausea  You may resume your Plavix and Aspirin 24 hours after surgery    FOLLOW-UP If you develop a Fever (>101.5), Redness or Drainage from the surgical incision site, please call our office to arrange for an evaluation. Please call the office to schedule a follow-up appointment for a wound check, 7-10 days post-operatively.  IF YOU HAVE ANY QUESTIONS, PLEASE FEEL FREE TO CALL OUR OFFICE.  HELPFUL INFORMATION  If you had a block, it will wear off between 8-24 hrs postop typically.  This is period when your pain may go from nearly zero to the pain you  would have had post-op without the block.  This is an abrupt transition but nothing dangerous is happening.  You may take an extra dose of narcotic when this happens.  Your arm will be in a sling following surgery. You will be in this sling for the next 4 weeks.  I will let you know the exact duration at your follow-up visit.  You may be more comfortable sleeping in a semi-seated position the first few nights following surgery.  Keep a pillow propped under the elbow and forearm for comfort.  If you have a recliner type of chair it might be beneficial.  If not that is fine too, but it would be helpful to sleep propped up with pillows  behind your operated shoulder as well under your elbow and forearm.  This will reduce pulling on the suture lines.  When dressing, put your operative arm in the sleeve first.  When getting undressed, take your operative arm out last.  Loose fitting, button-down shirts are recommended.  In most states it is against the law to drive while your arm is in a sling. And certainly against the law to drive while taking narcotics.  You may return to work/school in the next couple of days when you feel up to it. Desk work and typing in the sling is fine.  We suggest you use the pain medication the first night prior to going to bed, in order to ease any pain when the anesthesia wears off. You should avoid taking pain medications on an empty stomach as it will make you nauseous.  Do not drink alcoholic beverages or take illicit drugs when taking pain medications.  Pain medication may make you constipated.  Below are a few solutions to try in this order: Decrease the amount of pain medication if you arent having pain. Drink lots of decaffeinated fluids. Drink prune juice and/or each dried prunes  If the first 3 dont work start with additional solutions Take Colace - an over-the-counter stool softener Take Senokot - an over-the-counter laxative Take Miralax - a stronger over-the-counter laxative   Dental Antibiotics:  In most cases prophylactic antibiotics for Dental procdeures after total joint surgery are not necessary.  Exceptions are as follows:  1. History of prior total joint infection  2. Severely immunocompromised (Organ Transplant, cancer chemotherapy, Rheumatoid biologic meds such as Hadar)  3. Poorly controlled diabetes (A1C &gt; 8.0, blood glucose over 200)  If you have one of these conditions, contact your surgeon for an antibiotic prescription, prior to your dental procedure.   For more information including helpful videos and documents visit our website:    [email or web address]

## 2021-11-06 NOTE — Op Note (Signed)
Orthopaedic Surgery Operative Note (CSN: [id number])  [name]  1941-07-17 Date of Surgery: 11/06/2021   Diagnoses:  Right shoulder irreparable cuff tear  Procedure: Right reverse total Shoulder Arthroplasty   Operative Finding Successful completion of planned procedure.  Patient had a 3 tendon tear but we were able to mobilize and repair the subscapularis.  The supra and infraspinatus were irreparable.  We had good fixation with our lateralized implants.  Good bone quality.  Post-operative plan: The patient will be NWB in sling.  The patient will be will be discharged from PACU if continues to be stable as was plan prior to surgery.  DVT prophylaxis Aspirin 81 mg twice daily for 6 weeks.  Pain control with PRN pain medication preferring oral medicines.  Follow up plan will be scheduled in approximately 7 days for incision check and XR.  Physical therapy to start after first visit.  Implants: Tornier perform humeral size 3 stem, 39+0 polyethylene, 39+3 sphere, 29+3 baseplate, 6.5 x 45 center screw  Post-Op Diagnosis: Same Surgeons:Primary: Robert Gash, MD Assistants:Caroline McBane PA-C Location: [facility] ROOM 06 Anesthesia: General with Exparel Interscalene Antibiotics: Ancef 2g preop, Vancomycin 1000mg  locally Tourniquet time: None Estimated Blood Loss: 277 Complications: None Specimens: None Implants: Implant Name Type Inv. Item Serial No. Manufacturer Lot No. LRB No. Used Action  GLENOID BASEPLATE 29 +3 - [id number] Joint GLENOID BASEPLATE 29 +3  [employer] [id number] Right 1 Implanted  GLENOSPHERE PERFORM 39 3 - [id number] Shoulder GLENOSPHERE PERFORM 39 3  [employer] [id number] Right 1 Implanted  tornier perform humeral system humeral stem std short     [employer] [id number] Right 1 Implanted  tornier perform humeral system reversed insert thinckness +0    [employer] [id number] Right 1 Implanted  SCREW CENTRAL THREAD 6.5X45 - [id number] Screw SCREW CENTRAL THREAD 6.5X45   [employer]  Right 1 Implanted  SCREW 5.5X26 - [id number] Screw SCREW 5.5X26  [employer]  Right 1 Implanted  SCREW PERIPHERAL 42 - [id number] Screw SCREW PERIPHERAL 42  [employer]  Right 1 Implanted    Indications for Surgery:   Robert Horne is a 80 y.o. male with irreparable cuff tear.  Benefits and risks of operative and nonoperative management were discussed prior to surgery with patient/guardian(s) and informed consent form was completed.  Infection and need for further surgery were discussed as was prosthetic stability and cuff issues.  We additionally specifically discussed risks of axillary nerve injury, infection, periprosthetic fracture, continued pain and longevity of implants prior to beginning procedure.      Procedure:   The patient was identified in the preoperative holding area where the surgical site was marked. Block placed by anesthesia with exparel.  The patient was taken to the OR where a procedural timeout was called and the above noted anesthesia was induced.  The patient was positioned beachchair on allen table with spider arm positioner.  Preoperative antibiotics were dosed.  The patient's right shoulder was prepped and draped in the usual sterile fashion.  A second preoperative timeout was called.       Standard deltopectoral approach was performed with a #10 blade. We dissected down to the subcutaneous tissues and the cephalic vein was taken laterally with the deltoid. Clavipectoral fascia was incised in line with the incision. Deep retractors were placed. The long of the biceps tendon was identified and there was significant tenosynovitis present.  Tenodesis was performed to the pectoralis tendon with #2 Ethibond. The remaining biceps was followed up into the  rotator interval where it was released.   The subscapularis was taken down in a full thickness layer with capsule along the humeral neck extending inferiorly around the humeral head. We continued releasing the capsule  directly off of the osteophytes inferiorly all the way around the corner. This allowed [place] to dislocate the humeral head.   The rotator cuff was carefully examined and noted to be irreperably torn.  The decision was confirmed that a reverse total shoulder was indicated for this patient.  There were osteophytes along the inferior humeral neck. The osteophytes were removed with an osteotome and a rongeur.  Osteophytes were removed with a rongeur and an osteotome and the anatomic neck was well visualized.     A humeral cutting guide was used extra medullary with a pin to help control version. The version was set at 20 of retroversion. Humeral osteotomy was performed with an oscillating saw. The head fragment was passed off the back table.  A cut protector plate was placed.  The subscapularis was again identified and immediately we took care to palpate the axillary nerve anteriorly and verify its position with gentle palpation as well as the tug test.  We then released the SGHL with bovie cautery prior to placing a curved mayo at the junction of the anterior glenoid well above the axillary nerve and bluntly dissecting the subscapularis from the capsule.  We then carefully protected the axillary nerve as we gently released the inferior capsule to fully mobilize the subscapularis.  An anterior deltoid retractor was then placed as well as a small Hohmann retractor superiorly.  The glenoid was relatively intact in the setting of an irreparable cuff tear  The remaining labrum was removed circumferentially taking great care not to disrupt the posterior capsule.   The glenoid drill guide was placed and used to drill a guide pin in the center, inferior position. The glenoid face was then reamed concentrically over the guide wire. The center hole was drilled over the guidepin in a near anatomic angle of version. Next the glenoid vault was drilled back to a depth of 45 mm.  We tapped and then placed a 75mm size  baseplate with additional 39mm lateralization was selected with a 6.5 mm x 45 mm length central screw.  The base plate was screwed into the glenoid vault obtaining secure fixation. We next placed superior and inferior locking screws for additional fixation.  Next a 39 +3 mm glenosphere was selected and impacted onto the baseplate. The center screw was tightened.  We turned attention back to the humeral side. The cut protector was removed.  We used the perform humeral sizing block to select the appropriate size which for this patient was a 3.  We then placed our center pin and reamed over it concentrically obtaining appropriate inset.  We then used our lateralizing chisel to prepare the lateral aspect of the humerus.  At that point we selected the appropriate implant trialing a 3.  Using this trial implant we trialed multiple polyethylene sizes settling on a 0 which provided good stability and range of motion without excess soft tissue tension. The offset was dialed in to match the normal anatomy. The shoulder was trialed.  There was good ROM in all planes and the shoulder was stable with no inferior translation.  The real humeral implants were opened after again confirming sizes.  The trial was removed. #5 Fiberwire x4 sutures passed through the humeral neck for subscap repair. The humeral component was press-fit obtaining  a secure fit. The joint was reduced and thoroughly irrigated with pulsatile lavage. Subscap was repaired back with #5 Fiberwire sutures through bone tunnels. Hemostasis was obtained. The deltopectoral interval was reapproximated with #1 Ethibond. The subcutaneous tissues were closed with 2-0 Vicryl and the skin was closed with running monocryl.    The wounds were cleaned and dried and an Aquacel dressing was placed. The drapes taken down. The arm was placed into sling with abduction pillow. Patient was awakened, extubated, and transferred to the recovery room in stable condition. There were  no intraoperative complications. The sponge, needle, and attention counts were  correct at the end of the case.     Noemi Chapel, PA-C, present and scrubbed throughout the case, critical for completion in a timely fashion, and for retraction, instrumentation, closure.

## 2021-11-06 NOTE — Anesthesia Postprocedure Evaluation (Signed)
Anesthesia Post Note  Patient: Robert Horne  Procedure(s) Performed: REVERSE SHOULDER ARTHROPLASTY (Right: Shoulder)     Patient location during evaluation: PACU Anesthesia Type: General Level of consciousness: awake and alert and oriented Pain management: pain level controlled Vital Signs Assessment: post-procedure vital signs reviewed and stable Respiratory status: spontaneous breathing, nonlabored ventilation and respiratory function stable Cardiovascular status: blood pressure returned to baseline and stable Postop Assessment: no apparent nausea or vomiting Anesthetic complications: no   No notable events documented.  Last Vitals:  Vitals:   11/06/21 1000 11/06/21 1015  BP: 129/77 122/79  Pulse: 61 (!) 59  Resp: 12 17  Temp:    SpO2: 100% 100%    Last Pain:  Vitals:   11/06/21 0954  TempSrc:   PainSc: 0-No pain                 Dam Ashraf A.

## 2021-11-06 NOTE — Interval H&P Note (Signed)
All questions answered, patient would like to dc home after surgery.

## 2021-11-06 NOTE — Transfer of Care (Signed)
Immediate Anesthesia Transfer of Care Note  Patient: Yoniel Arkwright  Procedure(s) Performed: REVERSE SHOULDER ARTHROPLASTY (Right: Shoulder)  Patient Location: PACU  Anesthesia Type:General  Level of Consciousness: awake, alert  and oriented  Airway & Oxygen Therapy: Patient Spontanous Breathing and Patient connected to face mask oxygen  Post-op Assessment: Report given to RN and Post -op Vital signs reviewed and stable  Post vital signs: Reviewed and stable  Last Vitals:  Vitals Value Taken Time  BP 143/80   Temp    Pulse 67 11/06/21 0954  Resp 16 11/06/21 0954  SpO2 100 % 11/06/21 0954  Vitals shown include unvalidated device data.  Last Pain:  Vitals:   11/06/21 0710  TempSrc: Oral         Complications: No notable events documented.

## 2021-11-06 NOTE — Anesthesia Procedure Notes (Signed)
Procedure Name: Intubation Date/Time: 11/06/2021 8:37 AM Performed by: Niel Hummer, CRNA Pre-anesthesia Checklist: Patient identified and Emergency Drugs available Patient Re-evaluated:Patient Re-evaluated prior to induction Oxygen Delivery Method: Circle system utilized Preoxygenation: Pre-oxygenation with 100% oxygen Induction Type: IV induction Laryngoscope Size: Mac and 4 Grade View: Grade II Tube type: Oral Tube size: 7.5 mm Number of attempts: 1 Airway Equipment and Method: Stylet Placement Confirmation: ETT inserted through vocal cords under direct vision, positive ETCO2 and breath sounds checked- equal and bilateral Secured at: 22 cm Tube secured with: Tape Dental Injury: Teeth and Oropharynx as per pre-operative assessment

## 2021-11-06 NOTE — Anesthesia Procedure Notes (Signed)
Anesthesia Regional Block: Interscalene brachial plexus block   Pre-Anesthetic Checklist: , timeout performed,  Correct Patient, Correct Site, Correct Laterality,  Correct Procedure, Correct Position, site marked,  Risks and benefits discussed,  Surgical consent,  Pre-op evaluation,  At surgeon's request and post-op pain management  Laterality: Right  Prep: chloraprep       Needles:  Injection technique: Single-shot  Needle Type: Echogenic Stimulator Needle     Needle Length: 10cm  Needle Gauge: 21     Additional Needles:   Procedures:,,,, ultrasound used (permanent image in chart),,    Narrative:  Start time: 11/06/2021 8:00 AM End time: 11/06/2021 8:05 AM Injection made incrementally with aspirations every 5 mL.  Performed by: Personally  Anesthesiologist: Josephine Igo, MD  Additional Notes: Timeout performed. Patient sedated. Relevant anatomy ID'd using [place]. Incremental 2-110ml injection of LA with frequent aspiration. Patient tolerated procedure well.    Right Interscalene Block

## 2021-11-08 ENCOUNTER — Encounter (HOSPITAL_COMMUNITY): Payer: Self-pay | Admitting: Orthopaedic Surgery

## 2021-11-17 ENCOUNTER — Other Ambulatory Visit: Payer: Self-pay

## 2021-11-17 ENCOUNTER — Encounter (HOSPITAL_COMMUNITY): Payer: Self-pay | Admitting: Emergency Medicine

## 2021-11-17 ENCOUNTER — Observation Stay (HOSPITAL_COMMUNITY)
Admission: EM | Admit: 2021-11-17 | Discharge: 2021-11-19 | Disposition: A | Discharge status: Home / Self Care | Payer: Medicare Other | Attending: Emergency Medicine | Admitting: Emergency Medicine

## 2021-11-17 ENCOUNTER — Emergency Department (HOSPITAL_COMMUNITY): Payer: Medicare Other

## 2021-11-17 DIAGNOSIS — Z8546 Personal history of malignant neoplasm of prostate: Secondary | ICD-10-CM | POA: Insufficient documentation

## 2021-11-17 DIAGNOSIS — Z79899 Other long term (current) drug therapy: Secondary | ICD-10-CM | POA: Insufficient documentation

## 2021-11-17 DIAGNOSIS — R0602 Shortness of breath: Secondary | ICD-10-CM | POA: Diagnosis present

## 2021-11-17 DIAGNOSIS — I4891 Unspecified atrial fibrillation: Secondary | ICD-10-CM | POA: Diagnosis not present

## 2021-11-17 DIAGNOSIS — Z7902 Long term (current) use of antithrombotics/antiplatelets: Secondary | ICD-10-CM | POA: Insufficient documentation

## 2021-11-17 DIAGNOSIS — U071 COVID-19: Secondary | ICD-10-CM | POA: Insufficient documentation

## 2021-11-17 DIAGNOSIS — I25118 Atherosclerotic heart disease of native coronary artery with other forms of angina pectoris: Secondary | ICD-10-CM | POA: Diagnosis present

## 2021-11-17 DIAGNOSIS — Z96611 Presence of right artificial shoulder joint: Secondary | ICD-10-CM | POA: Diagnosis not present

## 2021-11-17 DIAGNOSIS — I2510 Atherosclerotic heart disease of native coronary artery without angina pectoris: Secondary | ICD-10-CM | POA: Insufficient documentation

## 2021-11-17 DIAGNOSIS — Z7982 Long term (current) use of aspirin: Secondary | ICD-10-CM | POA: Insufficient documentation

## 2021-11-17 DIAGNOSIS — E039 Hypothyroidism, unspecified: Secondary | ICD-10-CM | POA: Diagnosis present

## 2021-11-17 DIAGNOSIS — Z87891 Personal history of nicotine dependence: Secondary | ICD-10-CM | POA: Diagnosis not present

## 2021-11-17 LAB — CBC WITH DIFFERENTIAL/PLATELET
Abs Immature Granulocytes: 0.06 10*3/uL (ref 0.00–0.07)
Basophils Absolute: 0.1 10*3/uL (ref 0.0–0.1)
Basophils Relative: 1 %
Eosinophils Absolute: 0 10*3/uL (ref 0.0–0.5)
Eosinophils Relative: 0 %
HCT: 38.6 % — ABNORMAL LOW (ref 39.0–52.0)
Hemoglobin: 12.6 g/dL — ABNORMAL LOW (ref 13.0–17.0)
Immature Granulocytes: 1 %
Lymphocytes Relative: 6 %
Lymphs Abs: 0.7 10*3/uL (ref 0.7–4.0)
MCH: 30.5 pg (ref 26.0–34.0)
MCHC: 32.6 g/dL (ref 30.0–36.0)
MCV: 93.5 fL (ref 80.0–100.0)
Monocytes Absolute: 1.2 10*3/uL — ABNORMAL HIGH (ref 0.1–1.0)
Monocytes Relative: 10 %
Neutro Abs: 10 10*3/uL — ABNORMAL HIGH (ref 1.7–7.7)
Neutrophils Relative %: 82 %
Platelets: 356 10*3/uL (ref 150–400)
RBC: 4.13 MIL/uL — ABNORMAL LOW (ref 4.22–5.81)
RDW: 12.3 % (ref 11.5–15.5)
WBC: 12 10*3/uL — ABNORMAL HIGH (ref 4.0–10.5)
nRBC: 0 % (ref 0.0–0.2)

## 2021-11-17 LAB — LACTATE DEHYDROGENASE: LDH: 249 U/L — ABNORMAL HIGH (ref 98–192)

## 2021-11-17 LAB — COMPREHENSIVE METABOLIC PANEL
ALT: 25 U/L (ref 0–44)
AST: 24 U/L (ref 15–41)
Albumin: 3.5 g/dL (ref 3.5–5.0)
Alkaline Phosphatase: 141 U/L — ABNORMAL HIGH (ref 38–126)
Anion gap: 9 (ref 5–15)
BUN: 8 mg/dL (ref 8–23)
CO2: 23 mmol/L (ref 22–32)
Calcium: 8.9 mg/dL (ref 8.9–10.3)
Chloride: 102 mmol/L (ref 98–111)
Creatinine, Ser: 0.93 mg/dL (ref 0.61–1.24)
GFR, Estimated: >60 mL/min (ref >60)
Glucose, Bld: 115 mg/dL — ABNORMAL HIGH (ref 70–99)
Potassium: 3.7 mmol/L (ref 3.5–5.1)
Sodium: 134 mmol/L — ABNORMAL LOW (ref 135–145)
Total Bilirubin: 0.9 mg/dL (ref 0.3–1.2)
Total Protein: 7.1 g/dL (ref 6.5–8.1)

## 2021-11-17 LAB — APTT: aPTT: 36 seconds (ref 24–36)

## 2021-11-17 LAB — PROTIME-INR
INR: 1 (ref 0.8–1.2)
Prothrombin Time: 13.5 seconds (ref 11.4–15.2)

## 2021-11-17 LAB — MAGNESIUM: Magnesium: 1.9 mg/dL (ref 1.7–2.4)

## 2021-11-17 LAB — FERRITIN: Ferritin: 197 ng/mL (ref 24–336)

## 2021-11-17 LAB — D-DIMER, QUANTITATIVE: D-Dimer, Quant: 4.07 ug/mL-FEU — ABNORMAL HIGH (ref 0.00–0.50)

## 2021-11-17 LAB — PROCALCITONIN: Procalcitonin: <0.1 ng/mL

## 2021-11-17 LAB — FIBRINOGEN: Fibrinogen: 719 mg/dL — ABNORMAL HIGH (ref 210–475)

## 2021-11-17 LAB — RESP PANEL BY RT-PCR (FLU A&B, COVID) ARPGX2
Influenza A by PCR: NEGATIVE
Influenza B by PCR: NEGATIVE
SARS Coronavirus 2 by RT PCR: POSITIVE — AB

## 2021-11-17 LAB — C-REACTIVE PROTEIN: CRP: 4.2 mg/dL — ABNORMAL HIGH (ref <1.0)

## 2021-11-17 LAB — TRIGLYCERIDES: Triglycerides: 71 mg/dL (ref <150)

## 2021-11-17 LAB — LACTIC ACID, PLASMA: Lactic Acid, Venous: 1.5 mmol/L (ref 0.5–1.9)

## 2021-11-17 MED ORDER — SODIUM CHLORIDE 0.9 % IV BOLUS
500.0000 mL | Freq: Once | INTRAVENOUS | Status: AC
  Administered 2021-11-17: 16:00:00 500 mL via INTRAVENOUS

## 2021-11-17 MED ORDER — ACETAMINOPHEN 325 MG PO TABS
650.0000 mg | ORAL_TABLET | Freq: Once | ORAL | Status: AC
  Administered 2021-11-17: 19:00:00 650 mg via ORAL
  Filled 2021-11-17: qty 2

## 2021-11-17 MED ORDER — ACETAMINOPHEN 325 MG PO TABS
650.0000 mg | ORAL_TABLET | ORAL | Status: DC | PRN
  Administered 2021-11-18 (×2): 650 mg via ORAL
  Filled 2021-11-17 (×2): qty 2

## 2021-11-17 MED ORDER — ONDANSETRON HCL 4 MG/2ML IJ SOLN: 4.0000 mg | Freq: Four times a day (QID) | INTRAMUSCULAR | Status: DC | PRN

## 2021-11-17 MED ORDER — DILTIAZEM HCL-DEXTROSE 125-5 MG/125ML-% IV SOLN (PREMIX)
5.0000 mg/h | INTRAVENOUS | Status: DC
  Administered 2021-11-17: 16:00:00 5 mg/h via INTRAVENOUS
  Administered 2021-11-18: 06:00:00 7.5 mg/h via INTRAVENOUS
  Filled 2021-11-17 (×2): qty 125

## 2021-11-17 MED ORDER — HEPARIN (PORCINE) 25000 UT/250ML-% IV SOLN
1200.0000 [IU]/h | INTRAVENOUS | Status: DC
  Administered 2021-11-17: 21:00:00 1000 [IU]/h via INTRAVENOUS
  Filled 2021-11-17 (×2): qty 250

## 2021-11-17 MED ORDER — DILTIAZEM LOAD VIA INFUSION
20.0000 mg | Freq: Once | INTRAVENOUS | Status: AC
  Administered 2021-11-17: 16:00:00 20 mg via INTRAVENOUS
  Filled 2021-11-17: qty 20

## 2021-11-17 MED ORDER — HEPARIN BOLUS VIA INFUSION
4000.0000 [IU] | Freq: Once | INTRAVENOUS | Status: AC
  Administered 2021-11-17: 21:00:00 4000 [IU] via INTRAVENOUS
  Filled 2021-11-17: qty 4000

## 2021-11-17 NOTE — ED Triage Notes (Signed)
Pt arrives c/o mild SOB and sore throat from COVID 19. Pt endorses headache, shaking all over, and fever. Denies CP, cough. Pt tested positive for COVID today. Pt lives at [facility].

## 2021-11-17 NOTE — H&P (Signed)
History and Physical   Robert Horne ZHG:[id number] DOB: Jan 05, 1941 DOA: 11/17/2021  Referring MD/NP/PA: Dr Matilde Sprang  PCP: [name], MD   Outpatient Specialists: Dr Griffin Basil, Orthopedics   [facility] coming [facility]  Chief Complaint: Palpitations and COVID-positive  HPI: Robert Horne is a 80 y.o. male with medical history significant of coronary artery disease status post stent about 30 years ago, hypothyroidism, prostate cancer, osteoarthritis status post reverse right shoulder arthroplasty on December 14 who came in from a independent living facility today secondary to being diagnosed with COVID-19 infection.  In the ER patient was found to have significant A. fib with RVR.  Rate in the 140s.  He is also having some chest discomfort.  Initial enzymes negative.  Patient initiated on Cardizem drip as well as heparin drip.  He is being admitted to the hospital for evaluation and treatment.  He is asymptomatic from the [place] standpoint except for some mild scratchy throat early this morning.  He is currently stable however.  Patient denied any other complaints..  ED Course: Temperature 100.9 blood pressure 149/77, pulse 147, respiratory 24, and oxygen sats 94% on room air.  Sodium is 134 glucose 115 alkaline phos 141.  LDH 249.  Ferritin 197.  CRP 4.2.  White count is 12,000.  Fibrinogen 719 D-dimer 4.07.  COVID-19 screen is positive.  Chest x-ray shows no acute findings.  Patient being admitted to the hospital with A. fib with RVR for further treatment.  Review of Systems: As per HPI otherwise 10 point review of systems negative.    Past Medical History:  Diagnosis Date   Cancer [facility])    prostate, 2002   H/O heart artery stent    Hypothyroidism     Past Surgical History:  Procedure Laterality Date   BACK SURGERY     CARDIAC CATHETERIZATION     CATARACT EXTRACTION Bilateral    PROSTATE SURGERY     REVERSE SHOULDER ARTHROPLASTY Right 11/06/2021   Procedure: REVERSE  SHOULDER ARTHROPLASTY;  Surgeon: Hiram Gash, MD;  Location: [facility];  Service: Orthopedics;  Laterality: Right;   TONSILLECTOMY       reports that he quit smoking about 51 years ago. His smoking use included cigarettes. He has never used smokeless tobacco. He reports that he does not drink alcohol and does not use drugs.  No Known Allergies  No family history on file.   Prior to Admission medications   Medication Sig Start Date End Date Taking? Authorizing Provider  acetaminophen (TYLENOL) 500 MG tablet Take 2 tablets (1,000 mg total) by mouth every 8 (eight) hours for 14 days. 11/06/21 11/20/21  Ethelda Chick, PA-C  amLODipine (NORVASC) 2.5 MG tablet Take 2.5 mg by mouth daily.    [provider]  aspirin EC 81 MG tablet Take 81 mg by mouth daily. Swallow whole.    [provider]  Cholecalciferol (VITAMIN D) 50 MCG (2000 UT) tablet Take 2,000 Units by mouth daily.    [provider]  clopidogrel (PLAVIX) 75 MG tablet Take 75 mg by mouth daily.    [provider]  levothyroxine (SYNTHROID) 50 MCG tablet Take 50 mcg by mouth daily before breakfast.    [provider]  mirtazapine (REMERON) 45 MG tablet Take 45 mg by mouth at bedtime.    [provider]  omeprazole (PRILOSEC) 20 MG capsule Take 20 mg by mouth daily.    [provider]  pravastatin (PRAVACHOL) 10 MG tablet Take 10 mg  by mouth daily.    [provider]  thyroid (ARMOUR) 60 MG tablet Take 60 mg by mouth daily before breakfast.    [provider]  triamcinolone cream (KENALOG) 0.1 % Apply 1 application topically 2 (two) times daily as needed (eczema).    [provider]    Physical Exam: Vitals:   11/17/21 1745 11/17/21 1800 11/17/21 1815 11/17/21 1828  BP: 116/66 (!) 122/54 119/66   Pulse: (!) 101 (!) 121 (!) 111   Resp: (!) 24 (!) 23 16   Temp:    (!) 100.9 F (38.3 C)  TempSrc:    Oral  SpO2: 94% 95% 94%   Weight:       Height:          Constitutional: Acutely ill looking no distress Vitals:   11/17/21 1745 11/17/21 1800 11/17/21 1815 11/17/21 1828  BP: 116/66 (!) 122/54 119/66   Pulse: (!) 101 (!) 121 (!) 111   Resp: (!) 24 (!) 23 16   Temp:    (!) 100.9 F (38.3 C)  TempSrc:    Oral  SpO2: 94% 95% 94%   Weight:      Height:       Eyes: PERRL, lids and conjunctivae normal ENMT: Mucous membranes are moist. Posterior pharynx clear of any exudate or lesions.Normal dentition.  Neck: normal, supple, no masses, no thyromegaly Respiratory: clear to auscultation bilaterally, no wheezing, no crackles. Normal respiratory effort. No accessory muscle use.  Cardiovascular: Irregularly irregular with tachycardia, no murmurs / rubs / gallops. No extremity edema. 2+ pedal pulses. No carotid bruits.  Abdomen: no tenderness, no masses palpated. No hepatosplenomegaly. Bowel sounds positive.  Musculoskeletal: no clubbing / cyanosis.  Right shoulder immobilized with sling in place no contractures. Normal muscle tone.  Skin: no rashes, lesions, ulcers. No induration Neurologic: CN 2-12 grossly intact. Sensation intact, DTR normal. Strength 5/5 in all 4.  Psychiatric: Normal judgment and insight. Alert and oriented x 3. Normal mood.     Labs on Admission: I have personally reviewed following labs and imaging studies  CBC: Recent Labs  Lab 11/17/21 1603  WBC 12.0*  NEUTROABS 10.0*  HGB 12.6*  HCT 38.6*  MCV 93.5  PLT 062   Basic Metabolic Panel: Recent Labs  Lab 11/17/21 1603  NA 134*  K 3.7  CL 102  CO2 23  GLUCOSE 115*  BUN 8  CREATININE 0.93  CALCIUM 8.9  MG 1.9   GFR: Estimated Creatinine Clearance: 65.2 mL/min (by C-G formula based on SCr of 0.93 mg/dL). Liver Function Tests: Recent Labs  Lab 11/17/21 1603  AST 24  ALT 25  ALKPHOS 141*  BILITOT 0.9  PROT 7.1  ALBUMIN 3.5   No results for input(s): LIPASE, AMYLASE in the last 168 hours. No results for input(s): AMMONIA in the  last 168 hours. Coagulation Profile: Recent Labs  Lab 11/17/21 1603  INR 1.0   Cardiac Enzymes: No results for input(s): CKTOTAL, CKMB, CKMBINDEX, TROPONINI in the last 168 hours. BNP (last 3 results) No results for input(s): PROBNP in the last 8760 hours. HbA1C: No results for input(s): HGBA1C in the last 72 hours. CBG: No results for input(s): GLUCAP in the last 168 hours. Lipid Profile: Recent Labs    11/17/21 1603  TRIG 71   Thyroid Function Tests: No results for input(s): TSH, T4TOTAL, FREET4, T3FREE, THYROIDAB in the last 72 hours. Anemia Panel: Recent Labs    11/17/21 1603  FERRITIN 197   Urine analysis: No  results found for: COLORURINE, APPEARANCEUR, LABSPEC, PHURINE, GLUCOSEU, HGBUR, BILIRUBINUR, KETONESUR, PROTEINUR, UROBILINOGEN, NITRITE, LEUKOCYTESUR Sepsis Labs: @LABRCNTIP (procalcitonin:4,lacticidven:4) ) Recent Results (from the past 240 hour(s))  Resp Panel by RT-PCR (Flu A&B, Covid) Nasopharyngeal Swab     Status: Abnormal   Collection Time: 11/17/21  4:18 PM   Specimen: Nasopharyngeal Swab; Nasopharyngeal(NP) swabs in vial transport medium  Result Value Ref Range Status   SARS Coronavirus 2 by RT PCR POSITIVE (A) NEGATIVE Final    Comment: (NOTE) SARS-CoV-2 target nucleic acids are DETECTED.  The SARS-CoV-2 RNA is generally detectable in upper respiratory specimens during the acute phase of infection. Positive results are indicative of the presence of the identified virus, but do not rule out bacterial infection or co-infection with other pathogens not detected by the test. Clinical correlation with patient history and other diagnostic information is necessary to determine patient infection status. The expected result is Negative.  Fact Sheet for Patients: EntrepreneurPulse.com.au  Fact Sheet for Healthcare Providers: IncredibleEmployment.be  This test is not yet approved or cleared by the [place] FDA  and  has been authorized for detection and/or diagnosis of SARS-CoV-2 by FDA under an Emergency Use Authorization (EUA).  This EUA will remain in effect (meaning this test can be used) for the duration of  the COVID-19 declaration under Section 564(b)(1) of the A ct, 21 U.S.C. section 360bbb-3(b)(1), unless the authorization is terminated or revoked sooner.     Influenza A by PCR NEGATIVE NEGATIVE Final   Influenza B by PCR NEGATIVE NEGATIVE Final    Comment: (NOTE) The Xpert Xpress SARS-CoV-2/FLU/RSV plus assay is intended as an aid in the diagnosis of influenza from Nasopharyngeal swab specimens and should not be used as a sole basis for treatment. Nasal washings and aspirates are unacceptable for Xpert Xpress SARS-CoV-2/FLU/RSV testing.  Fact Sheet for Patients: EntrepreneurPulse.com.au  Fact Sheet for Healthcare Providers: IncredibleEmployment.be  This test is not yet approved or cleared by the [place] FDA and has been authorized for detection and/or diagnosis of SARS-CoV-2 by FDA under an Emergency Use Authorization (EUA). This EUA will remain in effect (meaning this test can be used) for the duration of the COVID-19 declaration under Section 564(b)(1) of the Act, 21 U.S.C. section 360bbb-3(b)(1), unless the authorization is terminated or revoked.  Performed at [facility], [place] [address], [place], [place] [address]      Radiological Exams on Admission: DG Chest Port 1 View  Result Date: 11/17/2021 CLINICAL DATA:  sob EXAM: PORTABLE CHEST 1 VIEW COMPARISON:  September 20, 2021 FINDINGS: The cardiomediastinal silhouette is unchanged in contour. No pleural effusion. No pneumothorax. No acute pleuroparenchymal abnormality. Visualized abdomen is unremarkable. Status post RIGHT shoulder arthroplasty. There is a trace amount of air along the superior aspect of the glenoid aspect of the arthroplasty which is likely postsurgical in  etiology. IMPRESSION: 1.  No acute cardiopulmonary abnormality. 2. There is a trace amount of air along the superior aspect of the RIGHT shoulder glenoid aspect of the arthroplasty which is likely postsurgical in etiology. Correlate with clinical symptomatology. Electronically Signed   By: Valentino Saxon M.D.   On: 11/17/2021 16:18    EKG: Independently reviewed.  Shows A. fib with RVR with a rate of 140s.  Assessment/Plan Principal Problem:   Atrial fibrillation with rapid ventricular response (HCC) Active Problems:   COVID-19 virus infection   CAD (coronary artery disease)   Hypothyroidism     #1 new onset atrial fibrillation with rapid response: Patient will  be admitted to telemetry floor in the progressive side.  It currently on Cardizem drip with heparin drip.  We will continue.  Continue to monitor.  Get echocardiogram.  Consider cardiology consultation.  #2 remote coronary artery disease: No cardiac issues for the last 30 years.  We will continue telemetry monitoring and get the echo.  Needs to follow-up with cardiology for follow-up.  Cycle enzymes.  #3 COVID-19 infection: Largely asymptomatic.  Patient will benefit from Paxlovid or prophylactic dose of remdesivir.  Since he is inpatient I will go ahead and give the remdesivir.  Continue to monitor.  Dexamethasone may be added.  #4 hypothyroidism: Confirm on continue home levothyroxine.  #5 recent shoulder surgery: Right shoulder is immobilized.  Continue per orthopedics.   DVT prophylaxis: Heparin drip Code Status: Full code Family Communication: No family at bedside Disposition Plan: To be determined Consults called: None Admission status: Inpatient  Severity of Illness: The appropriate patient status for this patient is INPATIENT. Inpatient status is judged to be reasonable and necessary in order to provide the required intensity of service to ensure the patient's safety. The patient's presenting symptoms, physical  exam findings, and initial radiographic and laboratory data in the context of their chronic comorbidities is felt to place them at high risk for further clinical deterioration. Furthermore, it is not anticipated that the patient will be medically stable for discharge from the hospital within 2 midnights of admission.   * I certify that at the point of admission it is my clinical judgment that the patient will require inpatient hospital care spanning beyond 2 midnights from the point of admission due to high intensity of service, high risk for further deterioration and high frequency of surveillance required.Barbette Merino MD [facility] Pager [phone]  If 7PM-7AM, please contact night-coverage [email or web address] Password [facility]  11/17/2021, 7:09 PM

## 2021-11-17 NOTE — ED Provider Notes (Signed)
[facility] EMERGENCY DEPARTMENT Provider Note   CSN: [id number] Arrival date & time: 11/17/21  1515     History Chief Complaint  Patient presents with   Covid Positive    Robert Horne is a 80 y.o. male.  With past medical history of hypothyroidism, history of coronary artery stenting who presents to the emergency department from [facility] independent living with Robert Horne.  Patient states that for 2 days he has had headache, sore throat, cough and tremors.  He states that he was seen by the independent living community physician and tested positive for COVID.  He states that at the instruction of his daughter and provider he should be evaluated.  Daughter, Robert Horne, at bedside states that she thought he appeared short of breath and flushed.  Patient states that he has also had decreased appetite.  He denies chest pain, palpitations, shortness of breath, abdominal pain, nausea, vomiting, diarrhea, lower extremity swelling.  Of note he had a right total shoulder arthroplasty on 11/06/2021 with Dr. Griffin Horne.   HPI     Past Medical History:  Diagnosis Date   Cancer [facility])    prostate, 2002   H/O heart artery stent    Hypothyroidism     There are no problems to display for this patient.   Past Surgical History:  Procedure Laterality Date   BACK SURGERY     CARDIAC CATHETERIZATION     CATARACT EXTRACTION Bilateral    PROSTATE SURGERY     REVERSE SHOULDER ARTHROPLASTY Right 11/06/2021   Procedure: REVERSE SHOULDER ARTHROPLASTY;  Surgeon: Hiram Gash, MD;  Location: [facility] ORS;  Service: Orthopedics;  Laterality: Right;   TONSILLECTOMY         No family history on file.  Social History   Tobacco Use   Smoking status: Former    Types: Cigarettes    Quit date: 10/24/1970    Years since quitting: 51.1   Smokeless tobacco: Never  Vaping Use   Vaping Use: Never used  Substance Use Topics   Alcohol use: Never   Drug use: Never    Home Medications Prior  to Admission medications   Medication Sig Start Date End Date Taking? Authorizing Provider  acetaminophen (TYLENOL) 500 MG tablet Take 2 tablets (1,000 mg total) by mouth every 8 (eight) hours for 14 days. 11/06/21 11/20/21  Ethelda Chick, PA-C  amLODipine (NORVASC) 2.5 MG tablet Take 2.5 mg by mouth daily.    [provider]  aspirin EC 81 MG tablet Take 81 mg by mouth daily. Swallow whole.    [provider]  Cholecalciferol (VITAMIN D) 50 MCG (2000 UT) tablet Take 2,000 Units by mouth daily.    [provider]  clopidogrel (PLAVIX) 75 MG tablet Take 75 mg by mouth daily.    [provider]  levothyroxine (SYNTHROID) 50 MCG tablet Take 50 mcg by mouth daily before breakfast.    [provider]  mirtazapine (REMERON) 45 MG tablet Take 45 mg by mouth at bedtime.    [provider]  omeprazole (PRILOSEC) 20 MG capsule Take 20 mg by mouth daily.    [provider]  pravastatin (PRAVACHOL) 10 MG tablet Take 10 mg by mouth daily.    [provider]  thyroid (ARMOUR) 60 MG tablet Take 60 mg by mouth daily before breakfast.    [provider]  triamcinolone cream (KENALOG) 0.1 % Apply 1 application topically 2 (two) times daily as needed (eczema).    [provider]    Allergies    Patient has no known allergies.  Review of Systems   Review of Systems  Constitutional:  Positive for appetite change, chills and fever.  HENT:  Positive for sore throat.   Respiratory:  Positive for cough. Negative for shortness of breath.   Cardiovascular:  Negative for chest pain, palpitations and leg swelling.  Gastrointestinal:  Negative for abdominal pain, diarrhea, nausea and vomiting.  Genitourinary:  Negative for dysuria.  Neurological:  Positive for tremors. Negative for dizziness, syncope and light-headedness.  All other systems reviewed and are negative.  Physical Exam Updated Vital Signs BP (!) 149/77     Pulse (!) 123    Temp 100 F (37.8 C) (Oral)    Resp 19    Ht 5\' 11"  (1.803 m)    Wt 78 kg    SpO2 97%    BMI 23.99 kg/m   Physical Exam Vitals and nursing note reviewed.  Constitutional:      General: He is not in acute distress.    Appearance: Normal appearance. He is normal weight. He is ill-appearing. He is not toxic-appearing.  HENT:     Head: Normocephalic and atraumatic.     Nose: Nose normal. No congestion.     Mouth/Throat:     Mouth: Mucous membranes are moist.     Pharynx: Oropharynx is clear.  Eyes:     General: No scleral icterus.    Extraocular Movements: Extraocular movements intact.     Conjunctiva/sclera: Conjunctivae normal.     Pupils: Pupils are equal, round, and reactive to light.  Cardiovascular:     Rate and Rhythm: Tachycardia present. Rhythm irregularly irregular.     Pulses: Normal pulses.          Radial pulses are 2+ on the right side and 2+ on the left side.     Heart sounds: No murmur heard.    Comments: Patient in atrial fibrillation with rapid ventricular response Pulmonary:     Effort: Tachypnea present. No respiratory distress or retractions.     Breath sounds: Decreased breath sounds present.  Abdominal:     General: Bowel sounds are normal. There is no distension.     Palpations: Abdomen is soft.     Tenderness: There is no abdominal tenderness.  Musculoskeletal:        General: Normal range of motion.     Cervical back: Normal range of motion and neck supple.     Right lower leg: No edema.     Left lower leg: No edema.     Comments: Right arm in sling from arthroplasty.  Skin:    General: Skin is warm and dry.     Capillary Refill: Capillary refill takes less than 2 seconds.  Neurological:     General: No focal deficit present.     Mental Status: He is alert and oriented to person, place, and time. Mental status is at baseline.  Psychiatric:        Mood and Affect: Mood normal.        Behavior: Behavior normal.        Thought  Content: Thought content normal.        Judgment: Judgment normal.    ED Results / Procedures / Treatments   Labs (all labs ordered are listed, but only abnormal results are displayed) Labs Reviewed  RESP PANEL BY RT-PCR (FLU A&B, COVID) ARPGX2 - Abnormal; Notable for the following components:  Result Value   SARS Coronavirus 2 by RT PCR POSITIVE (*)    All other components within normal limits  CBC WITH DIFFERENTIAL/PLATELET - Abnormal; Notable for the following components:   WBC 12.0 (*)    RBC 4.13 (*)    Hemoglobin 12.6 (*)    HCT 38.6 (*)    Neutro Abs 10.0 (*)    Monocytes Absolute 1.2 (*)    All other components within normal limits  COMPREHENSIVE METABOLIC PANEL - Abnormal; Notable for the following components:   Sodium 134 (*)    Glucose, Bld 115 (*)    Alkaline Phosphatase 141 (*)    All other components within normal limits  LACTATE DEHYDROGENASE - Abnormal; Notable for the following components:   LDH 249 (*)    All other components within normal limits  C-REACTIVE PROTEIN - Abnormal; Notable for the following components:   CRP 4.2 (*)    All other components within normal limits  D-DIMER, QUANTITATIVE (NOT AT [facility]) - Abnormal; Notable for the following components:   D-Dimer, Quant 4.07 (*)    All other components within normal limits  FIBRINOGEN - Abnormal; Notable for the following components:   Fibrinogen 719 (*)    All other components within normal limits  MAGNESIUM  PROTIME-INR  APTT  LACTIC ACID, PLASMA  PROCALCITONIN  FERRITIN  TRIGLYCERIDES  HEPARIN LEVEL (UNFRACTIONATED)  CBC   EKG EKG Interpretation  Date/Time:  Sunday November 17 2021 15:35:19 EST Ventricular Rate:  151 PR Interval:    QRS Duration: 84 QT Interval:  288 QTC Calculation: 457 R Axis:   -49 Text Interpretation: Atrial fibrillation with rapid V-rate Ventricular premature complex Left anterior fascicular block Repolarization abnormality, prob rate related afib is new  Confirmed by Varney Biles [phone]) on 11/17/2021 3:54:15 PM  Radiology DG Chest Port 1 View  Result Date: 11/17/2021 CLINICAL DATA:  sob EXAM: PORTABLE CHEST 1 VIEW COMPARISON:  September 20, 2021 FINDINGS: The cardiomediastinal silhouette is unchanged in contour. No pleural effusion. No pneumothorax. No acute pleuroparenchymal abnormality. Visualized abdomen is unremarkable. Status post RIGHT shoulder arthroplasty. There is a trace amount of air along the superior aspect of the glenoid aspect of the arthroplasty which is likely postsurgical in etiology. IMPRESSION: 1.  No acute cardiopulmonary abnormality. 2. There is a trace amount of air along the superior aspect of the RIGHT shoulder glenoid aspect of the arthroplasty which is likely postsurgical in etiology. Correlate with clinical symptomatology. Electronically Signed   By: Valentino Saxon M.D.   On: 11/17/2021 16:18    Procedures .Critical Care Performed by: Mickie Hillier, PA-C Authorized by: Mickie Hillier, PA-C   Critical care provider statement:    Critical care time (minutes):  40   Critical care time was exclusive of:  Separately billable procedures and treating other patients   Critical care was necessary to treat or prevent imminent or life-threatening deterioration of the following conditions:  Cardiac failure   Critical care was time spent personally by me on the following activities:  Development of treatment plan with patient or surrogate, discussions with consultants, discussions with primary provider, evaluation of patient's response to treatment, examination of patient, interpretation of cardiac output measurements, obtaining history from patient or surrogate, review of old charts, re-evaluation of patient's condition, pulse oximetry, ordering and review of radiographic studies, ordering and review of laboratory studies and ordering and performing treatments and interventions   I assumed direction of critical care for  this patient from another provider in  my specialty: no     Care discussed with: admitting provider     Medications Ordered in ED Medications  diltiazem (CARDIZEM) 1 mg/mL load via infusion 20 mg (20 mg Intravenous Bolus from Bag 11/17/21 1616)    And  diltiazem (CARDIZEM) 125 mg in dextrose 5% 125 mL (1 mg/mL) infusion (7.5 mg/hr Intravenous Infusion Verify 11/17/21 1756)  heparin bolus via infusion 4,000 Units (has no administration in time range)  heparin ADULT infusion 100 units/mL (25000 units/260mL) (has no administration in time range)  sodium chloride 0.9 % bolus 500 mL (0 mLs Intravenous Stopped 11/17/21 1756)  acetaminophen (TYLENOL) tablet 650 mg (650 mg Oral Given 11/17/21 1923)   ED Course  I have reviewed the triage vital signs and the nursing notes.  Pertinent labs & imaging results that were available during my care of the patient were reviewed by me and considered in my medical decision making (see chart for details).   This patients CHA2DS2-VASc Score and unadjusted Ischemic Stroke Rate (% per year) is equal to 2.2 % stroke rate/year from a score of 2  MDM Rules/Calculators/A&P 80 year old male who presents to the emergency department with COVID-19.  Found in atrial fibrillation with rapid ventricular rate.  He does not have a history of the same. Patient hemodynamically stable with stable blood pressure despite heart rate of 140.  He is mentating.  Attempted vagal maneuvers without decrease heart rate. Patient also symptomatic from COVID-19. He appears flushed and mildly short of breath. Likely multifactorial.   Atrial Fibrillation  EKG without ischemia or infarction Attempted vagal maneuvers without improvement Given 20 mg diltiazem push followed by diltiazem drip with improvement in heart rate. Patient is not anticoagulated, initiating heparin bolus and infusion. Will also give 500 mL IV fluid bolus Likely resulting from acute illness with COVID-19.  Unclear how  long the patient has been in atrial fibrillation as he is asymptomatic about palpitations or chest pain  COVID-19 Diagnosed today Chest x-ray without overt pneumonia He does have fever and new onset atrial fibrillation however doubt patient is septic at this time.  Will not initiate sepsis protocol or provide with antibiotics. COVID-19 labs ordered Leukocytosis to 12  1841: Spoke with Dr. Jonelle Sidle who agrees with admission  Final Clinical Impression(s) / ED Diagnoses Final diagnoses:  Atrial fibrillation with rapid ventricular response [facility])    Rx / DC Orders ED Discharge Orders     None        Mickie Hillier, PA-C 11/17/21 2035    Varney Biles, MD 11/17/21 [phone]

## 2021-11-17 NOTE — Progress Notes (Signed)
ANTICOAGULATION CONSULT NOTE - Initial Consult  Pharmacy Consult for heparin Indication: atrial fibrillation  No Known Allergies  Patient Measurements: Height: 5\' 11"  (180.3 cm) Weight: 78 kg (172 lb) IBW/kg (Calculated) : 75.3 Heparin Dosing Weight: TBW  Vital Signs: Temp: 100 F (37.8 C) (12/25 1536) Temp Source: Oral (12/25 1536) BP: 125/66 (12/25 1730) Pulse Rate: 106 (12/25 1730)  Labs: Recent Labs    11/17/21 1603  HGB 12.6*  HCT 38.6*  PLT 356    Estimated Creatinine Clearance: 68.2 mL/min (by C-G formula based on SCr of 0.89 mg/dL).   Medical History: Past Medical History:  Diagnosis Date   Cancer [facility])    prostate, 2002   H/O heart artery stent    Hypothyroidism     Assessment: 37 YOM presenting with SOB, recent COVID infection, fever/chills, found to be in afib, he is not on anticoagulation PTA  Goal of Therapy:  Heparin level 0.3-0.7 units/ml Monitor platelets by anticoagulation protocol: Yes   Plan:  Heparin 4000 units IV x 1, and gtt at 1000 units/hr F/u 8 hour heparin level  Bertis Ruddy, PharmD Clinical Pharmacist ED Pharmacist Phone # [phone] 11/17/2021 5:57 PM

## 2021-11-18 ENCOUNTER — Inpatient Hospital Stay (HOSPITAL_BASED_OUTPATIENT_CLINIC_OR_DEPARTMENT_OTHER): Payer: Medicare Other

## 2021-11-18 DIAGNOSIS — I4891 Unspecified atrial fibrillation: Secondary | ICD-10-CM

## 2021-11-18 DIAGNOSIS — U071 COVID-19: Secondary | ICD-10-CM | POA: Diagnosis not present

## 2021-11-18 DIAGNOSIS — E039 Hypothyroidism, unspecified: Secondary | ICD-10-CM

## 2021-11-18 DIAGNOSIS — I2510 Atherosclerotic heart disease of native coronary artery without angina pectoris: Secondary | ICD-10-CM | POA: Diagnosis not present

## 2021-11-18 LAB — BASIC METABOLIC PANEL
Anion gap: 11 (ref 5–15)
BUN: 10 mg/dL (ref 8–23)
CO2: 19 mmol/L — ABNORMAL LOW (ref 22–32)
Calcium: 8.2 mg/dL — ABNORMAL LOW (ref 8.9–10.3)
Chloride: 104 mmol/L (ref 98–111)
Creatinine, Ser: 0.93 mg/dL (ref 0.61–1.24)
GFR, Estimated: >60 mL/min (ref >60)
Glucose, Bld: 111 mg/dL — ABNORMAL HIGH (ref 70–99)
Potassium: 3.7 mmol/L (ref 3.5–5.1)
Sodium: 134 mmol/L — ABNORMAL LOW (ref 135–145)

## 2021-11-18 LAB — CBC
HCT: 34.2 % — ABNORMAL LOW (ref 39.0–52.0)
Hemoglobin: 11.7 g/dL — ABNORMAL LOW (ref 13.0–17.0)
MCH: 31 pg (ref 26.0–34.0)
MCHC: 34.2 g/dL (ref 30.0–36.0)
MCV: 90.5 fL (ref 80.0–100.0)
Platelets: 300 10*3/uL (ref 150–400)
RBC: 3.78 MIL/uL — ABNORMAL LOW (ref 4.22–5.81)
RDW: 12.6 % (ref 11.5–15.5)
WBC: 8.9 10*3/uL (ref 4.0–10.5)
nRBC: 0 % (ref 0.0–0.2)

## 2021-11-18 LAB — LIPID PANEL
Cholesterol: 141 mg/dL (ref 0–200)
HDL: 42 mg/dL (ref >40)
LDL Cholesterol: 87 mg/dL (ref 0–99)
Total CHOL/HDL Ratio: 3.4 RATIO
Triglycerides: 61 mg/dL (ref <150)
VLDL: 12 mg/dL (ref 0–40)

## 2021-11-18 LAB — HEPARIN LEVEL (UNFRACTIONATED)
Heparin Unfractionated: 0.2 IU/mL — ABNORMAL LOW (ref 0.30–0.70)
Heparin Unfractionated: 0.52 IU/mL (ref 0.30–0.70)

## 2021-11-18 LAB — ECHOCARDIOGRAM LIMITED
Area-P 1/2: 4.15 cm2
Height: 71 in
S' Lateral: 2.8 cm
Weight: 2850.11 oz

## 2021-11-18 MED ORDER — MOLNUPIRAVIR EUA 200MG CAPSULE: 4.0000 | ORAL_CAPSULE | Freq: Two times a day (BID) | ORAL | 0 refills | Status: AC

## 2021-11-18 MED ORDER — LACTATED RINGERS IV SOLN: INTRAVENOUS | Status: DC

## 2021-11-18 MED ORDER — HEPARIN BOLUS VIA INFUSION
2000.0000 [IU] | Freq: Once | INTRAVENOUS | Status: AC
  Administered 2021-11-18: 02:00:00 2000 [IU] via INTRAVENOUS
  Filled 2021-11-18: qty 2000

## 2021-11-18 MED ORDER — MOLNUPIRAVIR EUA 200MG CAPSULE
4.0000 | ORAL_CAPSULE | Freq: Two times a day (BID) | ORAL | Status: DC
  Administered 2021-11-18 (×2): 800 mg via ORAL
  Filled 2021-11-18: qty 4

## 2021-11-18 MED ORDER — LACTATED RINGERS IV BOLUS
1000.0000 mL | Freq: Once | INTRAVENOUS | Status: AC
  Administered 2021-11-18: 06:00:00 1000 mL via INTRAVENOUS

## 2021-11-18 NOTE — Discharge Summary (Signed)
Physician Discharge Summary  Robert Horne URK:[id number] DOB: 1941/11/09 DOA: 11/17/2021  PCP: [name], MD  Admit date: 11/17/2021 Discharge date: 11/18/2021  Admitted From: [facility] Disposition: Show Low   Recommendations for Outpatient Follow-up:  Follow up with PCP in 1-2 weeks Establish care with cardiology after isolation period for transient provoked, new, AFib with RVR that has durably converted to sinus rhythm. Consider outpatient cardiac monitoring.   Home Health: None Equipment/Devices: None Discharge Condition: Stable CODE STATUS: Full Diet recommendation: Heart healthy  Brief/Interim Summary: Robert Horne is an 80 y.o. male with a history of CAD s/p PCI, TIA 2017, hypothyroidism, right shoulder arthoplasty 12/14 who recently moved to [place] from [place], [place] and presented to the ED 12/25 due to covid-19 infection. Robert Horne generally the evening of 12/24 and tested positive for covid-19, was sent to ED due to fever. He was febrile to 100.22F, found to have new onset atrial fibrillation with rates in 140's, normotensive. CRP 4.2. CXR without evidence of pneumonia and patient was not hypoxemic. Heparin infusion and diltiazem infusion started and the patient admitted for AFib with RVR provoked by covid-19 infection. Molnupiravir has been started 12/26. Overnight, rhythm converted to normal sinus and diltiazem infusion was stopped due to hypotension.   Discharge Diagnoses:  Principal Problem:   Atrial fibrillation with rapid ventricular response (HCC) Active Problems:   COVID-19 virus infection   CAD (coronary artery disease)   Hypothyroidism  New onset paroxysmal atrial fibrillation: With RVR that has since resolved, remains in NSR off AV nodal agent.  - Note also history of sinus bradycardia limiting use of beta blockers in past. - CHA2DS2-VASc score is at least 5 (CAD, TIA, age[2]). Will continue IV heparin pending  cardiology recommendation.  - Echocardiogram shows LVEF 60-65%, no RWMA, no remark on diastolic function. RV, PASP normal. Myxomatous MV with mild MVP, normal AV.   Covid-19 infection: Symptomatic without pneumonia or hypoxemia. He is certainly at high risk of progression to severe disease, however. PCT negative. CRP 4.2, concomitant with elevated d-dimer which is expected with covid-19. Without hypoxemia, chest pain, hemoptysis, hx VTE, will defer V/Q or CTA.  - Initiated molnupiravir 800mg  BID x5 days on 12/26. Avoiding paxlovid due to drug interactions including antiplatelets and anticoagulation.  - Isolation x10 days discussed with patient and daughter - s/p 3 vaccinations, though last was 2021. Recommend repeat vaccination at/around 90 days from this infection.   CAD s/p LAD and diagonal PCI 2003, HLD, HTN: with nonobstructive catheterizations since that time (2005, 2007, 2014). No recent anginal complaints.  - Previously under the care of Dr. Daphane Shepherd in [facility] system, pt has relocated in [place] and will follow up with local cardiology. Dr. Virgina Jock consulted, recommends discharge without anticoagulation with outpatient follow up, planning cardiac monitoring. - Continue pravastatin for now, though may benefit from trial of augmented intensity with LDL 87 (>70). HDL is 42. Note intolerant of rosuvastatin in the past.  - Continue ASA, plavix.    TIA: 2017. Had heart monitoring afterward without any atrial fibrillation detected. Significant duration of bradycardia.  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 Griffin Basil: Stable - Continue outpatient PT   Hypothyroidism: TSH was 1.30 Jun 2021 and 2.05 Jan 2021 and 3.16 June 2020 and 1.72 April 2020 and 0.63 April 2019 and 1.35 Oct 2018 - Continue synthroid 51mcg.  Discharge Instructions Discharge Instructions  Amb referral to AFIB Clinic   Complete by: As directed    Diet -  low sodium heart healthy   Complete by: As directed    Discharge instructions   Complete by: As directed    You were evaluated for covid-19 infection without pneumonia. You should continue taking the antiviral molnupiravir twice daily for 5 days to reduce your risk of worsening. You were found to have atrial fibrillation with rapid ventricular response(AFib with RVR) which resolved rather quickly. It has not recurred even after stopping the rate controlling medication. This was provoked by covid infection. Your case was reviewed by cardiology who feels that given the brief and provoked nature of this episode, no anticoagulation is needed going forward. You will continue aspirin and plavix as you were as well as all other medications that you were taking. You should be contacted to arrange follow up including a heart monitor as an outpatient in the next week. Call the office number provided here if you do not hear from the cardiology clinic.   You should remain in isolation due to covid-19 infection for 10 total days. It is recommended that you get an additional booster vaccination in about 90 days.   If your symptoms worsen/return, seek medical attention right away.   Increase activity slowly   Complete by: As directed    MyChart COVID-19 home monitoring program   Complete by: Nov 18, 2021    Is the patient willing to use the [place] for home monitoring?: Yes   No dressing needed   Complete by: As directed       Allergies as of 11/18/2021   No Known Allergies      Medication List     STOP taking these medications    thyroid 60 MG tablet Commonly known as: ARMOUR       TAKE these medications    acetaminophen 500 MG tablet Commonly known as: TYLENOL Take 2 tablets (1,000 mg total) by mouth every 8 (eight) hours for 14 days. What changed:  when to take this reasons to take this   amLODipine 2.5 MG tablet Commonly known as: NORVASC Take 2.5 mg by mouth daily.    aspirin EC 81 MG tablet Take 81 mg by mouth daily. Swallow whole.   clopidogrel 75 MG tablet Commonly known as: PLAVIX Take 75 mg by mouth daily.   Dialyvite Vitamin D 5000 125 MCG (5000 UT) capsule Generic drug: Cholecalciferol Take 5,000 Units by mouth daily.   levothyroxine 50 MCG tablet Commonly known as: SYNTHROID Take 50 mcg by mouth daily before breakfast.   mirtazapine 45 MG tablet Commonly known as: REMERON Take 45 mg by mouth at bedtime.   molnupiravir EUA 200 mg Caps capsule Commonly known as: LAGEVRIO Take 4 capsules (800 mg total) by mouth 2 (two) times daily for 5 days.   omeprazole 20 MG capsule Commonly known as: PRILOSEC Take 20 mg by mouth daily.   pravastatin 10 MG tablet Commonly known as: PRAVACHOL Take 10 mg by mouth daily.               Discharge Care Instructions  (From admission, onward)           Start     Ordered   11/18/21 0000  No dressing needed        11/18/21 1724            Follow-up Information     [name], MD Follow up.  Specialty: Internal Medicine Contact information: [address] [place] [address] [phone]         Nigel Mormon, MD. Schedule an appointment as soon as possible for a visit in 1 week(s).   Specialties: Cardiology, Radiology Why: Call if you do not hear from them in the next week Contact information: [place] [address] [phone]                No Known Allergies  Consultations: Cardiology  Procedures/Studies: [facility] Chest Port 1 View  Result Date: 11/17/2021 CLINICAL DATA:  sob EXAM: PORTABLE CHEST 1 VIEW COMPARISON:  September 20, 2021 FINDINGS: The cardiomediastinal silhouette is unchanged in contour. No pleural effusion. No pneumothorax. No acute pleuroparenchymal abnormality. Visualized abdomen is unremarkable. Status post RIGHT shoulder arthroplasty. There is a trace amount of air along the  superior aspect of the glenoid aspect of the arthroplasty which is likely postsurgical in etiology. IMPRESSION: 1.  No acute cardiopulmonary abnormality. 2. There is a trace amount of air along the superior aspect of the RIGHT shoulder glenoid aspect of the arthroplasty which is likely postsurgical in etiology. Correlate with clinical symptomatology. Electronically Signed   By: Valentino Saxon M.D.   On: 11/17/2021 16:18   DG Shoulder Right Port  Result Date: 11/06/2021 CLINICAL DATA:  Status post right shoulder arthroplasty EXAM: PORTABLE RIGHT SHOULDER COMPARISON:  09/20/2021 FINDINGS: Status post right shoulder reverse arthroplasty with expected overlying postoperative change. No evidence of perihardware fracture or component malpositioning. IMPRESSION: Status post right shoulder reverse arthroplasty with expected overlying postoperative change. No evidence of perihardware fracture or component malpositioning. Electronically Signed   By: Delanna Ahmadi M.D.   On: 11/06/2021 10:42   ECHOCARDIOGRAM LIMITED  Result Date: 11/18/2021    ECHOCARDIOGRAM LIMITED REPORT   Patient Name:   Robert Horne Date of Exam: 11/18/2021 Medical Rec #:  [id number]    Height:       71.0 in Accession #:    [id number]   Weight:       178.1 lb Date of Birth:  [birth year]    BSA:          2.007 m Patient Age:    60 years     BP:           100/55 mmHg Patient Gender: M            HR:           70 bpm. Exam Location:  Inpatient Procedure: Limited Echo Indications:    atrial fibrillation  History:        Patient has no prior history of Echocardiogram examinations.                 CAD; Covid.  Sonographer:    Johny Chess RDCS Referring Phys: [place]  1. Left ventricular ejection fraction, by estimation, is 60 to 65%. The left ventricle has normal function. The left ventricle has no regional wall motion abnormalities.  2. Right ventricular systolic function is normal. The right ventricular size is  normal. There is normal pulmonary artery systolic pressure.  3. The mitral valve is myxomatous. Trivial mitral valve regurgitation. No evidence of mitral stenosis. 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  4. The aortic valve is normal in structure. Aortic valve regurgitation is not visualized. No aortic stenosis is present.  5. The inferior vena cava is normal in size  with greater than 50% respiratory variability, suggesting right atrial pressure of 3 mmHg. FINDINGS  Left Ventricle: Left ventricular ejection fraction, by estimation, is 60 to 65%. The left ventricle has normal function. The left ventricle has no regional wall motion abnormalities. The left ventricular internal cavity size was normal in size. There is  no left ventricular hypertrophy. Right Ventricle: The right ventricular size is normal. No increase in right ventricular wall thickness. Right ventricular systolic function is normal. There is normal pulmonary artery systolic pressure. The tricuspid regurgitant velocity is 2.04 m/s, and  with an assumed right atrial pressure of 3 mmHg, the estimated right ventricular systolic pressure is 95.6 mmHg. Left Atrium: Left atrial size was normal in size. Right Atrium: Right atrial size was normal in size. Pericardium: There is no evidence of pericardial effusion. Mitral Valve: The mitral valve is myxomatous. 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 Trivial mitral valve regurgitation. No evidence of mitral valve stenosis. Tricuspid Valve: The tricuspid valve is normal in structure. Tricuspid valve regurgitation is not demonstrated. No evidence of tricuspid stenosis. Aortic Valve: The aortic valve is normal in structure. Aortic valve regurgitation is not visualized. No aortic stenosis is present. Pulmonic Valve: The pulmonic valve was normal in structure. Pulmonic valve regurgitation is not visualized. No evidence of pulmonic stenosis. Aorta:  The aortic root is normal in size and structure. Venous: The inferior vena cava is normal in size with greater than 50% respiratory variability, suggesting right atrial pressure of 3 mmHg. IAS/Shunts: No atrial level shunt detected by color flow Doppler. LEFT VENTRICLE PLAX 2D LVIDd:         4.60 cm Diastology LVIDs:         2.80 cm LV e' medial:    7.51 cm/s LV PW:         1.00 cm LV E/e' medial:  14.2 LV IVS:        1.00 cm LV e' lateral:   9.46 cm/s                        LV E/e' lateral: 11.3  IVC IVC diam: 2.10 cm LEFT ATRIUM         Index LA diam:    3.50 cm 1.74 cm/m  AORTIC VALVE LVOT Vmax:   125.00 cm/s LVOT Vmean:  79.200 cm/s LVOT VTI:    0.249 m MITRAL VALVE                TRICUSPID VALVE MV Area (PHT): 4.15 cm     TR Peak grad:   16.6 mmHg MV Decel Time: 183 msec     TR Vmax:        204.00 cm/s MV E velocity: 107.00 cm/s MV A velocity: 90.70 cm/s   SHUNTS MV E/A ratio:  1.18         Systemic VTI: 0.25 m Dani Gobble Croitoru MD Electronically signed by Sanda Klein MD Signature Date/Time: 11/18/2021/1:36:37 PM    Final      Subjective: Had an episode of diarrhea last night as he was planning to leave the hospital and decision was made to remain in hospital overnight. No subsequent events. No abd pain, N/V, chest pain, palpitations, or shortness of breath or fever.   Discharge Exam: Vitals:   11/18/21 0724 11/18/21 0750  BP: (!) 100/55   Pulse: 63   Resp:    Temp:  98.6 F (37 C)  SpO2: 93%  General: Pt is alert, awake, not in acute distress Cardiovascular: RRR, S1/S2 +, no rubs, no gallops Respiratory: CTA bilaterally, no wheezing, no rhonchi Abdominal: Soft, NT, ND, bowel sounds + Extremities: No edema, no cyanosis  Labs: BNP (last 3 results) No results for input(s): BNP in the last 8760 hours. Basic Metabolic Panel: Recent Labs  Lab 11/17/21 1603 11/18/21 0422  NA 134* 134*  K 3.7 3.7  CL 102 104  CO2 23 19*  GLUCOSE 115* 111*  BUN 8 10  CREATININE 0.93 0.93   CALCIUM 8.9 8.2*  MG 1.9  --    Liver Function Tests: Recent Labs  Lab 11/17/21 1603  AST 24  ALT 25  ALKPHOS 141*  BILITOT 0.9  PROT 7.1  ALBUMIN 3.5   No results for input(s): LIPASE, AMYLASE in the last 168 hours. No results for input(s): AMMONIA in the last 168 hours. CBC: Recent Labs  Lab 11/17/21 1603 11/18/21 0422  WBC 12.0* 8.9  NEUTROABS 10.0*  --   HGB 12.6* 11.7*  HCT 38.6* 34.2*  MCV 93.5 90.5  PLT 356 300   Cardiac Enzymes: No results for input(s): CKTOTAL, CKMB, CKMBINDEX, TROPONINI in the last 168 hours. BNP: Invalid input(s): POCBNP CBG: No results for input(s): GLUCAP in the last 168 hours. D-Dimer Recent Labs    11/17/21 1603  DDIMER 4.07*   Hgb A1c No results for input(s): HGBA1C in the last 72 hours. Lipid Profile Recent Labs    11/17/21 1603 11/18/21 0422  CHOL  --  141  HDL  --  42  LDLCALC  --  87  TRIG 71 61  CHOLHDL  --  3.4   Thyroid function studies No results for input(s): TSH, T4TOTAL, T3FREE, THYROIDAB in the last 72 hours.  Invalid input(s): FREET3 Anemia work up Recent Labs    11/17/21 1603  FERRITIN 197   Urinalysis No results found for: COLORURINE, APPEARANCEUR, LABSPEC, [place], GLUCOSEU, [place], [place], KETONESUR, PROTEINUR, UROBILINOGEN, NITRITE, LEUKOCYTESUR  Microbiology Recent Results (from the past 240 hour(s))  Resp Panel by RT-PCR (Flu A&B, Covid) Nasopharyngeal Swab     Status: Abnormal   Collection Time: 11/17/21  4:18 PM   Specimen: Nasopharyngeal Swab; Nasopharyngeal(NP) swabs in vial transport medium  Result Value Ref Range Status   SARS Coronavirus 2 by RT PCR POSITIVE (A) NEGATIVE Final    Comment: (NOTE) SARS-CoV-2 target nucleic acids are DETECTED.  The SARS-CoV-2 RNA is generally detectable in upper respiratory specimens during the acute phase of infection. Positive results are indicative of the presence of the identified virus, but do not rule out bacterial infection or  co-infection with other pathogens not detected by the test. Clinical correlation with patient history and other diagnostic information is necessary to determine patient infection status. The expected result is Negative.  Fact Sheet for Patients: EntrepreneurPulse.com.au  Fact Sheet for Healthcare Providers: IncredibleEmployment.be  This test is not yet approved or cleared by the [place] FDA and  has been authorized for detection and/or diagnosis of SARS-CoV-2 by FDA under an Emergency Use Authorization (EUA).  This EUA will remain in effect (meaning this test can be used) for the duration of  the COVID-19 declaration under Section 564(b)(1) of the A ct, 21 U.S.C. section 360bbb-3(b)(1), unless the authorization is terminated or revoked sooner.     Influenza A by PCR NEGATIVE NEGATIVE Final   Influenza B by PCR NEGATIVE NEGATIVE Final    Comment: (NOTE) The Xpert Xpress SARS-CoV-2/FLU/RSV plus assay is intended as an  aid in the diagnosis of influenza from Nasopharyngeal swab specimens and should not be used as a sole basis for treatment. Nasal washings and aspirates are unacceptable for Xpert Xpress SARS-CoV-2/FLU/RSV testing.  Fact Sheet for Patients: EntrepreneurPulse.com.au  Fact Sheet for Healthcare Providers: IncredibleEmployment.be  This test is not yet approved or cleared by the [place] FDA and has been authorized for detection and/or diagnosis of SARS-CoV-2 by FDA under an Emergency Use Authorization (EUA). This EUA will remain in effect (meaning this test can be used) for the duration of the COVID-19 declaration under Section 564(b)(1) of the Act, 21 U.S.C. section 360bbb-3(b)(1), unless the authorization is terminated or revoked.  Performed at [facility], [place] [address] [place], [place] [address]     Time coordinating discharge: Approximately 40 minutes  Patrecia Pour,  MD  [facility] 11/18/2021, 5:25 PM

## 2021-11-18 NOTE — Progress Notes (Signed)
°  Echocardiogram 2D Echocardiogram has been performed.  Johny Chess 11/18/2021, 11:19 AM

## 2021-11-18 NOTE — Progress Notes (Signed)
1845 Nurse called to room w/ report of "cyclone bomb" diarrhea. Pt found shaking in room and weakened attempting to get dressed for discharge. Nurse cleaned room and pt had second bout of diarrhea in bed. Pt went to bathroom after cleaned and had third bout of diarrhea. Dr. Hurley Cisco notified and advised that pt should stay, but can decide. Pt decided to stay the night with daughters' advising. IV team consult in for STAT IV for LR infusion.   Rowland Lathe, RN 12.26.22 2004

## 2021-11-18 NOTE — Care Management CC44 (Signed)
Condition Code 44 Documentation Completed  Patient Details  Name: Robert Horne MRN: [id number] Date of Birth: Nov 09, 1941   Condition Code 44 given:  Yes Patient signature on Condition Code 44 notice:  Yes Documentation of 2 MD's agreement:  Yes Code 44 added to claim:  Yes    Laurena Slimmer, RN 11/18/2021, 5:57 PM

## 2021-11-18 NOTE — Progress Notes (Signed)
Pt's BP dropped to 89/51 from 105/54, rechecked BP was 82/48. Cardizem IV discontinued per parameters order and Dr. Alcario Drought was notified and ordered LR 1L bolus. Pt's converted to NSR, 12 L EKG done. Incoming RN updated.

## 2021-11-18 NOTE — Care Management Obs Status (Signed)
[place] NOTIFICATION   Patient Details  Name: Robert Horne MRN: [id number] Date of Birth: 1941-06-20   Medicare Observation Status Notification Given:  Ernesta Amble, RN 11/18/2021, 5:56 PM

## 2021-11-18 NOTE — Progress Notes (Signed)
Discharge instructions given, follow up information provided. Questions answered and resolved.   Rowland Lathe, RN 12.26.22 [phone]

## 2021-11-18 NOTE — Significant Event (Signed)
Called by RN, patient was about to leave to go to ILF, but then had severe explosive diarrhea.  Is very shaky / generally weak in room after episode.  No melena nor hematochezia.  But at this point, will cancel the DC order, pt planning on staying overnight (at least).  Will also start IVF.

## 2021-11-18 NOTE — Progress Notes (Signed)
ANTICOAGULATION CONSULT NOTE - Follow Up Consult  Pharmacy Consult for heparin Indication: atrial fibrillation  Labs: Recent Labs    11/17/21 1603 11/18/21 0112  HGB 12.6*  --   HCT 38.6*  --   PLT 356  --   APTT 36  --   LABPROT 13.5  --   INR 1.0  --   HEPARINUNFRC  --  0.20*  CREATININE 0.93  --     Assessment: 80yo male subtherapeutic on heparin with initial dosing for Afib; no infusion issues or signs of bleeding per RN.  Goal of Therapy:  Heparin level 0.3-0.7 units/ml   Plan:  Will give small bolus of heparin 2000 units and increase heparin infusion by 2-3 units/kg/hr to 1200 units/hr and check level in 8 hours.    Wynona Neat, PharmD, BCPS  11/18/2021,2:22 AM

## 2021-11-18 NOTE — Progress Notes (Signed)
PROGRESS NOTE  Robert Horne  FAO:[id number] DOB: Jun 12, 1941 DOA: 11/17/2021 PCP: [name], MD   Brief Narrative: Robert Horne is an 80 y.o. male with a history of CAD s/p PCI, TIA 2017, hypothyroidism, right shoulder arthoplasty 12/14 who recently moved to [place] from [place], [place] and presented to the ED 12/25 due to covid-19 infection. Robert Horne initially felt unwell generally the evening of 12/24 and tested positive for covid-19, was sent to ED due to fever. He was febrile to 100.21F, found to have new onset atrial fibrillation with rates in 140's, normotensive. CRP 4.2. CXR without evidence of pneumonia and patient was not hypoxemic. Heparin infusion and diltiazem infusion started and the patient admitted for AFib with RVR provoked by covid-19 infection. Molnupiravir has been started 12/26. Overnight, rhythm converted to normal sinus and diltiazem infusion was stopped due to hypotension.   Assessment & Plan: Principal Problem:   Atrial fibrillation with rapid ventricular response (HCC) Active Problems:   COVID-19 virus infection   CAD (coronary artery disease)   Hypothyroidism  New onset paroxysmal atrial fibrillation: With RVR that has since resolved, remains in NSR off AV nodal agent.  - Note also history of sinus bradycardia limiting use of beta blockers in past. - CHA2DS2-VASc score is at least 5 (CAD, TIA, age[2]). Will continue IV heparin pending cardiology recommendation.  - Echocardiogram shows LVEF 60-65%, no RWMA, no remark on diastolic function. RV, PASP normal. Myxomatous MV with mild MVP, normal AV.  Covid-19 infection: Symptomatic without pneumonia or hypoxemia. He is certainly at high risk of progression to severe disease, however. PCT negative. CRP 4.2, concomitant with elevated d-dimer which is expected with covid-19. Without hypoxemia, chest pain, hemoptysis, hx VTE, will defer V/Q or CTA.  - Initiated molnupiravir 800mg  BID x5 days on 12/26. Avoiding  paxlovid due to drug interactions including antiplatelets and anticoagulation.  - Isolation x10 days discussed with patient and daughter - s/p 3 vaccinations, though last was 2021. Recommend repeat vaccination at/around 90 days from this infection.  CAD s/p LAD and diagonal PCI 2003, HLD, HTN: with nonobstructive catheterizations since that time (2005, 2007, 2014). No recent anginal complaints.  - Previously undre the care of Dr. Daphane Shepherd in [facility] system, pt has relocated in [place] and will follow up with local cardiology. Dr. Virgina Jock consulted for additional recommendations.  - Continue pravastatin for now, though may benefit from trial of augmented intensity with LDL 87 (>70). HDL is 42. Note intolerant of rosuvastatin in the past.  - Hold low dose norvasc due to hypotension - Continue ASA, plavix.   TIA: 2017. Had heart monitoring afterward without any atrial fibrillation detected. Significant duration of bradycardia.  - Continue plavix, ASA, statin  OSA: Intolerant of CPAP  s/p right reverse shoulder arthroplasty 12/14 by Dr. Griffin Basil: Stable - Continue outpatient PT  Hypothyroidism: TSH was 1.30 Jun 2021 and 2.05 Jan 2021 and 3.16 June 2020 and 1.72 April 2020 and 0.63 April 2019 and 1.35 Oct 2018 - Continue synthroid 3mcg.  DVT prophylaxis: Heparin IV Code Status: Full Family Communication: Daughter at bedside Disposition Plan:  Status is: Inpatient Consultants:  Cardiology, Dr. Virgina Jock  Procedures:  Echo  Antimicrobials: Molnupiravir   Subjective: No chest pain or dyspnea or palpitations.   Objective: Vitals:   11/18/21 0533 11/18/21 0715 11/18/21 0724 11/18/21 0750  BP: (!) 89/51 (!) 101/58 (!) 100/55   Pulse: 81 (!) 57 63   Resp: 18     Temp:    98.6 F (37  C)  TempSrc:    Oral  SpO2: 95% 96% 93%   Weight:      Height:        Intake/Output Summary (Last 24 hours) at 11/18/2021 1627 Last data filed at 11/18/2021 0418 Gross per 24 hour  Intake 28.14 ml   Output 401 ml  Net -372.86 ml   Filed Weights   11/17/21 1536 11/17/21 2133  Weight: 78 kg 80.8 kg   Gen: 80 y.o. male in no distress  Pulm: Non-labored breathing room air. Clear to auscultation bilaterally.  CV: Regular rate and rhythm. No murmur, rub, or gallop. No JVD, no pitting pedal edema. GI: Abdomen soft, non-tender, non-distended, with normoactive bowel sounds. No organomegaly or masses felt. Ext: Warm, RUE in sling Skin: No rashes, lesions or ulcers Neuro: Alert and oriented. No focal neurological deficits. Psych: Judgement and insight appear normal. Mood & affect appropriate.   Data Reviewed: I have personally reviewed following labs and imaging studies  CBC: Recent Labs  Lab 11/17/21 1603 11/18/21 0422  WBC 12.0* 8.9  NEUTROABS 10.0*  --   HGB 12.6* 11.7*  HCT 38.6* 34.2*  MCV 93.5 90.5  PLT 356 762   Basic Metabolic Panel: Recent Labs  Lab 11/17/21 1603 11/18/21 0422  NA 134* 134*  K 3.7 3.7  CL 102 104  CO2 23 19*  GLUCOSE 115* 111*  BUN 8 10  CREATININE 0.93 0.93  CALCIUM 8.9 8.2*  MG 1.9  --    GFR: Estimated Creatinine Clearance: 65.2 mL/min (by C-G formula based on SCr of 0.93 mg/dL). Liver Function Tests: Recent Labs  Lab 11/17/21 1603  AST 24  ALT 25  ALKPHOS 141*  BILITOT 0.9  PROT 7.1  ALBUMIN 3.5   No results for input(s): LIPASE, AMYLASE in the last 168 hours. No results for input(s): AMMONIA in the last 168 hours. Coagulation Profile: Recent Labs  Lab 11/17/21 1603  INR 1.0   Cardiac Enzymes: No results for input(s): CKTOTAL, CKMB, CKMBINDEX, TROPONINI in the last 168 hours. BNP (last 3 results) No results for input(s): PROBNP in the last 8760 hours. HbA1C: No results for input(s): HGBA1C in the last 72 hours. CBG: No results for input(s): GLUCAP in the last 168 hours. Lipid Profile: Recent Labs    11/17/21 1603 11/18/21 0422  CHOL  --  141  HDL  --  42  LDLCALC  --  87  TRIG 71 61  CHOLHDL  --  3.4    Thyroid Function Tests: No results for input(s): TSH, T4TOTAL, FREET4, T3FREE, THYROIDAB in the last 72 hours. Anemia Panel: Recent Labs    11/17/21 1603  FERRITIN 197   Urine analysis: No results found for: COLORURINE, APPEARANCEUR, LABSPEC, PHURINE, GLUCOSEU, HGBUR, BILIRUBINUR, KETONESUR, PROTEINUR, UROBILINOGEN, NITRITE, LEUKOCYTESUR Recent Results (from the past 240 hour(s))  Resp Panel by RT-PCR (Flu A&B, Covid) Nasopharyngeal Swab     Status: Abnormal   Collection Time: 11/17/21  4:18 PM   Specimen: Nasopharyngeal Swab; Nasopharyngeal(NP) swabs in vial transport medium  Result Value Ref Range Status   SARS Coronavirus 2 by RT PCR POSITIVE (A) NEGATIVE Final    Comment: (NOTE) SARS-CoV-2 target nucleic acids are DETECTED.  The SARS-CoV-2 RNA is generally detectable in upper respiratory specimens during the acute phase of infection. Positive results are indicative of the presence of the identified virus, but do not rule out bacterial infection or co-infection with other pathogens not detected by the test. Clinical correlation with patient history and other diagnostic  information is necessary to determine patient infection status. The expected result is Negative.  Fact Sheet for Patients: EntrepreneurPulse.com.au  Fact Sheet for Healthcare Providers: IncredibleEmployment.be  This test is not yet approved or cleared by the [place] FDA and  has been authorized for detection and/or diagnosis of SARS-CoV-2 by FDA under an Emergency Use Authorization (EUA).  This EUA will remain in effect (meaning this test can be used) for the duration of  the COVID-19 declaration under Section 564(b)(1) of the A ct, 21 U.S.C. section 360bbb-3(b)(1), unless the authorization is terminated or revoked sooner.     Influenza A by PCR NEGATIVE NEGATIVE Final   Influenza B by PCR NEGATIVE NEGATIVE Final    Comment: (NOTE) The Xpert Xpress  SARS-CoV-2/FLU/RSV plus assay is intended as an aid in the diagnosis of influenza from Nasopharyngeal swab specimens and should not be used as a sole basis for treatment. Nasal washings and aspirates are unacceptable for Xpert Xpress SARS-CoV-2/FLU/RSV testing.  Fact Sheet for Patients: EntrepreneurPulse.com.au  Fact Sheet for Healthcare Providers: IncredibleEmployment.be  This test is not yet approved or cleared by the [place] FDA and has been authorized for detection and/or diagnosis of SARS-CoV-2 by FDA under an Emergency Use Authorization (EUA). This EUA will remain in effect (meaning this test can be used) for the duration of the COVID-19 declaration under Section 564(b)(1) of the Act, 21 U.S.C. section 360bbb-3(b)(1), unless the authorization is terminated or revoked.  Performed at [facility], [place] [address], [place], [place] [address]       Radiology Studies: DG Chest Port 1 View  Result Date: 11/17/2021 CLINICAL DATA:  sob EXAM: PORTABLE CHEST 1 VIEW COMPARISON:  September 20, 2021 FINDINGS: The cardiomediastinal silhouette is unchanged in contour. No pleural effusion. No pneumothorax. No acute pleuroparenchymal abnormality. Visualized abdomen is unremarkable. Status post RIGHT shoulder arthroplasty. There is a trace amount of air along the superior aspect of the glenoid aspect of the arthroplasty which is likely postsurgical in etiology. IMPRESSION: 1.  No acute cardiopulmonary abnormality. 2. There is a trace amount of air along the superior aspect of the RIGHT shoulder glenoid aspect of the arthroplasty which is likely postsurgical in etiology. Correlate with clinical symptomatology. Electronically Signed   By: Valentino Saxon M.D.   On: 11/17/2021 16:18   ECHOCARDIOGRAM LIMITED  Result Date: 11/18/2021    ECHOCARDIOGRAM LIMITED REPORT   Patient Name:   Robert Horne Date of Exam: 11/18/2021 Medical Rec #:  [id number]     Height:       71.0 in Accession #:    [id number]   Weight:       178.1 lb Date of Birth:  27-Apr-1941    BSA:          2.007 m Patient Age:    56 years     BP:           100/55 mmHg Patient Gender: M            HR:           70 bpm. Exam Location:  Inpatient Procedure: Limited Echo Indications:    atrial fibrillation  History:        Patient has no prior history of Echocardiogram examinations.                 CAD; Covid.  Sonographer:    Johny Chess RDCS Referring Phys: [facility]  1. Left ventricular ejection fraction, by estimation, is 60  to 65%. The left ventricle has normal function. The left ventricle has no regional wall motion abnormalities.  2. Right ventricular systolic function is normal. The right ventricular size is normal. There is normal pulmonary artery systolic pressure.  3. The mitral valve is myxomatous. Trivial mitral valve regurgitation. No evidence of mitral stenosis. 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  4. The aortic valve is normal in structure. Aortic valve regurgitation is not visualized. No aortic stenosis is present.  5. The inferior vena cava is normal in size with greater than 50% respiratory variability, suggesting right atrial pressure of 3 mmHg. FINDINGS  Left Ventricle: Left ventricular ejection fraction, by estimation, is 60 to 65%. The left ventricle has normal function. The left ventricle has no regional wall motion abnormalities. The left ventricular internal cavity size was normal in size. There is  no left ventricular hypertrophy. Right Ventricle: The right ventricular size is normal. No increase in right ventricular wall thickness. Right ventricular systolic function is normal. There is normal pulmonary artery systolic pressure. The tricuspid regurgitant velocity is 2.04 m/s, and  with an assumed right atrial pressure of 3 mmHg, the estimated right ventricular systolic pressure is 19.3 mmHg. Left Atrium: Left  atrial size was normal in size. Right Atrium: Right atrial size was normal in size. Pericardium: There is no evidence of pericardial effusion. Mitral Valve: The mitral valve is myxomatous. 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 Trivial mitral valve regurgitation. No evidence of mitral valve stenosis. Tricuspid Valve: The tricuspid valve is normal in structure. Tricuspid valve regurgitation is not demonstrated. No evidence of tricuspid stenosis. Aortic Valve: The aortic valve is normal in structure. Aortic valve regurgitation is not visualized. No aortic stenosis is present. Pulmonic Valve: The pulmonic valve was normal in structure. Pulmonic valve regurgitation is not visualized. No evidence of pulmonic stenosis. Aorta: The aortic root is normal in size and structure. Venous: The inferior vena cava is normal in size with greater than 50% respiratory variability, suggesting right atrial pressure of 3 mmHg. IAS/Shunts: No atrial level shunt detected by color flow Doppler. LEFT VENTRICLE PLAX 2D LVIDd:         4.60 cm Diastology LVIDs:         2.80 cm LV e' medial:    7.51 cm/s LV PW:         1.00 cm LV E/e' medial:  14.2 LV IVS:        1.00 cm LV e' lateral:   9.46 cm/s                        LV E/e' lateral: 11.3  IVC IVC diam: 2.10 cm LEFT ATRIUM         Index LA diam:    3.50 cm 1.74 cm/m  AORTIC VALVE LVOT Vmax:   125.00 cm/s LVOT Vmean:  79.200 cm/s LVOT VTI:    0.249 m MITRAL VALVE                TRICUSPID VALVE MV Area (PHT): 4.15 cm     TR Peak grad:   16.6 mmHg MV Decel Time: 183 msec     TR Vmax:        204.00 cm/s MV E velocity: 107.00 cm/s MV A velocity: 90.70 cm/s   SHUNTS MV E/A ratio:  1.18         Systemic VTI: 0.25 m Mihai  Croitoru MD Electronically signed by Sanda Klein MD Signature Date/Time: 11/18/2021/1:36:37 PM    Final     Scheduled Meds:  molnupiravir EUA  4 capsule Oral BID   Continuous Infusions:  diltiazem (CARDIZEM) infusion Stopped (11/18/21  0544)   heparin 1,200 Units/hr (11/18/21 0228)     LOS: 1 day   Time spent: 35 minutes.  Patrecia Pour, MD [facility] [email or web address] 11/18/2021, 4:27 PM

## 2021-11-18 NOTE — Progress Notes (Addendum)
Pt with SBP 89 on cardizem drip.  Had been on 7.5 mg/hr.  Cardizem drip held, and 1L LR bolus ordered.  Current HR 70s-80s A.fib.  Update: RN just informed me that pt converted to NSR for now.  200cc into the bolus BP is 97/51

## 2021-11-18 NOTE — Progress Notes (Signed)
80 y.o. male  with CAD, remote h/o PCI, h/o TIA-on DAPT, hypothyroidism, prostate cancer, OA s/p right shoulder arthroplasty 11/06/2021, admitted with COVID. Patient had brief Afib episode for <24 hrs, now in sinus rhythm. EF normal on echocardiogram. Although CHA2DS2VASc score is high at 5, okay to omit anticoagulation given very short duration in the setting of COVID, and continue DAPT as his baseline TIA management. Will need to investigate what duration if DAPT is required from h/o TIA standpoint. After COVID isolation is completed, will arrange outpatient office visit and monitor.    I discussed the above plan with Dr. Bonner Puna.   Nigel Mormon, MD Pager: [phone] Office: [phone]

## 2021-11-18 NOTE — Progress Notes (Signed)
ANTICOAGULATION CONSULT NOTE  Pharmacy Consult for heparin Indication: atrial fibrillation  No Known Allergies  Patient Measurements: Height: 5\' 11"  (180.3 cm) Weight: 80.8 kg (178 lb 2.1 oz) IBW/kg (Calculated) : 75.3 Heparin Dosing Weight: TBW  Vital Signs: Temp: 98.6 F (37 C) (12/26 0750) Temp Source: Oral (12/26 0750) BP: 100/55 (12/26 0724) Pulse Rate: 63 (12/26 0724)  Labs: Recent Labs    11/17/21 1603 11/18/21 0112 11/18/21 0422 11/18/21 1132  HGB 12.6*  --  11.7*  --   HCT 38.6*  --  34.2*  --   PLT 356  --  300  --   APTT 36  --   --   --   LABPROT 13.5  --   --   --   INR 1.0  --   --   --   HEPARINUNFRC  --  0.20*  --  0.52  CREATININE 0.93  --  0.93  --      Estimated Creatinine Clearance: 65.2 mL/min (by C-G formula based on SCr of 0.93 mg/dL).   Medical History: Past Medical History:  Diagnosis Date   Cancer [facility])    prostate, 2002   H/O heart artery stent    Hypothyroidism     Assessment: 3 YOM presenting with SOB, recent COVID infection, fever/chills, found to be in afib, he is not on anticoagulation PTA.  Heparin level is therapeutic at 0.52, CBC stable today.  Goal of Therapy:  Heparin level 0.3-0.7 units/ml Monitor platelets by anticoagulation protocol: Yes   Plan:  Continue heparin 1200 units/h Daily heparin level and CBC  Arrie Senate, PharmD, [place], [facility] Clinical Pharmacist [phone] Please check AMION for all [facility] Pharmacy numbers 11/18/2021

## 2021-11-19 DIAGNOSIS — I4891 Unspecified atrial fibrillation: Secondary | ICD-10-CM | POA: Diagnosis not present

## 2021-11-19 DIAGNOSIS — E039 Hypothyroidism, unspecified: Secondary | ICD-10-CM | POA: Diagnosis not present

## 2021-11-19 DIAGNOSIS — U071 COVID-19: Secondary | ICD-10-CM | POA: Diagnosis not present

## 2021-11-19 LAB — CBC
HCT: 38.9 % — ABNORMAL LOW (ref 39.0–52.0)
Hemoglobin: 13.2 g/dL (ref 13.0–17.0)
MCH: 30.8 pg (ref 26.0–34.0)
MCHC: 33.9 g/dL (ref 30.0–36.0)
MCV: 90.7 fL (ref 80.0–100.0)
Platelets: 278 10*3/uL (ref 150–400)
RBC: 4.29 MIL/uL (ref 4.22–5.81)
RDW: 12.5 % (ref 11.5–15.5)
WBC: 7.4 10*3/uL (ref 4.0–10.5)
nRBC: 0 % (ref 0.0–0.2)

## 2021-11-19 LAB — HEPARIN LEVEL (UNFRACTIONATED): Heparin Unfractionated: <0.1 IU/mL — ABNORMAL LOW (ref 0.30–0.70)

## 2021-11-19 NOTE — Progress Notes (Signed)
Pt's IV  accidentally came out when pt.tried to go to the BR ,maybe he forgot that he has IVF infusing. IV team consulted  & came to reinsert a new IV site but pt.refused. MD on call Dr.Gardner made aware.

## 2021-11-19 NOTE — Progress Notes (Signed)
VAST consult for PIV start for continuous IVF. Upon educating the patient on the rationale and procedure for the IV start, the patient began to state, "I have been stuck enough. I do not need another IV. I am going to be discharged in an hour." The bedside RN and provider have been made aware of the patient's refusal at this time.

## 2021-11-19 NOTE — Progress Notes (Signed)
Pt's temp.101.1;Tylenol 650 mg po given & MD on call made aware .No further orders received.Will continue to monitor pt.

## 2021-12-11 ENCOUNTER — Ambulatory Visit: Payer: Medicare Other | Admitting: Cardiology

## 2021-12-13 ENCOUNTER — Ambulatory Visit: Payer: Medicare Other | Admitting: Cardiology

## 2021-12-13 ENCOUNTER — Encounter: Payer: Self-pay | Admitting: Cardiology

## 2021-12-13 ENCOUNTER — Inpatient Hospital Stay: Payer: Medicare Other

## 2021-12-13 ENCOUNTER — Other Ambulatory Visit: Payer: Self-pay

## 2021-12-13 VITALS — BP 138/73 | HR 95 | Temp 98.7°F | Resp 16 | Ht 71.0 in | Wt 172.0 lb

## 2021-12-13 DIAGNOSIS — I480 Paroxysmal atrial fibrillation: Secondary | ICD-10-CM

## 2021-12-13 DIAGNOSIS — I2510 Atherosclerotic heart disease of native coronary artery without angina pectoris: Secondary | ICD-10-CM

## 2021-12-13 DIAGNOSIS — I4891 Unspecified atrial fibrillation: Secondary | ICD-10-CM

## 2021-12-13 NOTE — Progress Notes (Signed)
Patient referred by [name], MD for atrial fibrillation  Subjective:   Robert Horne, male    DOB: December 19, 1940, 81 y.o.   MRN: [id number]   Chief Complaint  Patient presents with   Atrial Fibrillation   New Patient (Initial Visit)   Coronary Artery Disease   Hospitalization Follow-up     HPI  81 y.o. male  with CAD, remote h/o PCI, h/o TIA-on DAPT, hypothyroidism, prostate cancer, OA s/p right shoulder arthroplasty 11/06/2021, admitted with COVID. Patient had brief Afib episode for <24 hrs, self converted to sinus rhythm. I had not seen the patient during this hospitalization, but had provided following recommendations pending outpatient follow up.  EF was normal on echocardiogram. Although CHA2DS2VASc score is high at 5, I felt it was okay to omit anticoagulation given very short duration in the setting of COVID, and continue DAPT as his baseline TIA management. Will need to investigate what duration if DAPT is required from h/o TIA standpoint. After COVID isolation is completed, will arrange outpatient office visit and monitor.    Patient is here for hospital follow-up visit today.  He and his wife moved from [place] to [place] to be at [employer].  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  He has known CAD with LAD stent in 2003, with subsequent caths showing nonobstructive disease, most recently in 2014.  Presently, he stays active with walking, taking care of his wife as primary caregiver.  He denies any complaints of chest pain, shortness of breath.  He also denies any palpitations and did not feel any symptoms during his short lasting episode of A. fib during COVID hospitalization in 10/2021.    Patient had an episode of TIA in 2017, where he had difficulty reading newspaper worse.  He has been on aspirin and Plavix since then.  Most recently, he underwent MRI brain in 07/2021, that showed chronic  microvascular changes, but no evidence of prior stroke.  Patient has not had lipid panel in over a year.  Previously, he did not tolerate Crestor due to myalgias, and is therefore on pravastatin.  Past Medical History:  Diagnosis Date   Cancer [facility])    prostate, 2002   H/O heart artery stent    Hypothyroidism      Past Surgical History:  Procedure Laterality Date   BACK SURGERY     CARDIAC CATHETERIZATION     CATARACT EXTRACTION Bilateral    PROSTATE SURGERY     REVERSE SHOULDER ARTHROPLASTY Right 11/06/2021   Procedure: REVERSE SHOULDER ARTHROPLASTY;  Surgeon: Hiram Gash, MD;  Location: [facility] ORS;  Service: Orthopedics;  Laterality: Right;   TONSILLECTOMY       Social History   Tobacco Use  Smoking Status Former   Types: Cigarettes   Quit date: 10/24/1970   Years since quitting: 60.1  Smokeless Tobacco Never    Social History   Substance and Sexual Activity  Alcohol Use Never     Family History  Problem Relation Age of Onset   Stroke Sister 49     Current Outpatient Medications on File Prior to Visit  Medication Sig Dispense Refill   amLODipine (NORVASC) 2.5 MG tablet Take 2.5 mg by mouth daily.     aspirin EC 81 MG tablet Take 81 mg by mouth daily. Swallow whole.     Cholecalciferol (DIALYVITE VITAMIN D 5000) 125 MCG (5000 UT) capsule Take 5,000 Units by mouth daily.  clopidogrel (PLAVIX) 75 MG tablet Take 75 mg by mouth daily.     esomeprazole (NEXIUM) 20 MG capsule Take 20 mg by mouth daily at 12 noon.     levothyroxine (SYNTHROID) 50 MCG tablet Take 50 mcg by mouth daily before breakfast.     mirtazapine (REMERON) 45 MG tablet Take 45 mg by mouth at bedtime.     pravastatin (PRAVACHOL) 10 MG tablet Take 10 mg by mouth daily.     No current facility-administered medications on file prior to visit.    Cardiovascular and other pertinent studies:  EKG 12/13/2021: Sinus rhythm 95 bpm Possible old anteroseptal infarct  Echocardiogram 11/18/2021: 1.  Left ventricular ejection fraction, by estimation, is 60 to 65%. The  left ventricle has normal function. The left ventricle has no regional  wall motion abnormalities.   2. Right ventricular systolic function is normal. The right ventricular  size is normal. There is normal pulmonary artery systolic pressure.   3. The mitral valve is myxomatous. Trivial mitral valve regurgitation. No  evidence of mitral stenosis. There is mild late systolic prolapse of both  leaflets of the mitral valve.   4. The aortic valve is normal in structure. Aortic valve regurgitation is  not visualized. No aortic stenosis is present.   5. The inferior vena cava is normal in size with greater than 50%  respiratory variability, suggesting right atrial pressure of 3 mmHg.    MRI brain 07/30/2021: 1. Mild white matter disease, likely ischemic and due to microvascular disease. This has  progressed very slightly since the prior MRI.  2. Mild mucosal thickening ethmoid air cells bilaterally.  Recent labs: 11/18/2021: Glucose 111, BUN/Cr 10/0.93. EGFR >60. Na/K 134/3.7.  H/H 13/38. MCV 90. Platelets 278 HbA1C NA Lipid panel NA TSH NA  06/05/2020: Chol 207, TG 69, HDL 55, LDL 138 ALKP 120.   Review of Systems  Cardiovascular:  Negative for chest pain, dyspnea on exertion, leg swelling, palpitations and syncope.       \ Vitals:   12/13/21 1301  BP: 138/73  Pulse: 95  Resp: 16  Temp: 98.7 F (37.1 C)  SpO2: 95%     Body mass index is 23.99 kg/m. Filed Weights   12/13/21 1301  Weight: 172 lb (78 kg)     Objective:   Physical Exam Vitals and nursing note reviewed.  Constitutional:      General: He is not in acute distress. Neck:     Vascular: No JVD.  Cardiovascular:     Rate and Rhythm: Normal rate and regular rhythm.     Heart sounds: Normal heart sounds. No murmur heard. Pulmonary:     Effort: Pulmonary effort is normal.     Breath sounds: Normal breath sounds. No wheezing or rales.   Musculoskeletal:     Right lower leg: No edema.     Left lower leg: No edema.          Assessment & Recommendations:   81 year old Caucasian male with CAD, remote h/o PCI, hypothyroidism, prostate cancer, brief A. fib episode during COVID hospitalization in 10/2021  Afib: Brief episode <24 hrs during [facility] hospitalization in 10/2021. He is currently on DAPT.  Okay to hold off anticoagulation, unless recurrent Afib. Recommend 2 week cardiac telemetry.  I have aldo encouraged him to consider wearing a smart watch  CAD: Remote history. No angina symptoms. Continue Aspirin. Check lipid panel  H/o TIA: Remote history. MRI brain 07/2021 showed no stroke, shows chronic microvascular changes. I  do not think he needs DAPT. Stopped Aspirin, continue plavix.   Further recommendations after above testing  Thank you for referring the patient to [place]. Please feel free to contact with any questions.   Nigel Mormon, MD Pager: [phone] Office: [phone]

## 2022-01-07 ENCOUNTER — Telehealth: Payer: Self-pay | Admitting: Cardiology

## 2022-01-10 ENCOUNTER — Ambulatory Visit: Payer: Medicare Other | Admitting: Cardiology

## 2022-01-10 ENCOUNTER — Other Ambulatory Visit: Payer: Self-pay

## 2022-01-10 ENCOUNTER — Encounter: Payer: Self-pay | Admitting: Cardiology

## 2022-01-10 VITALS — BP 119/64 | HR 67 | Temp 98.2°F | Resp 16 | Ht 71.0 in | Wt 172.8 lb

## 2022-01-10 DIAGNOSIS — I471 Supraventricular tachycardia: Secondary | ICD-10-CM

## 2022-01-10 DIAGNOSIS — I2510 Atherosclerotic heart disease of native coronary artery without angina pectoris: Secondary | ICD-10-CM

## 2022-01-10 MED ORDER — ATORVASTATIN CALCIUM 10 MG PO TABS: 10.0000 mg | ORAL_TABLET | Freq: Every day | ORAL | 3 refills | Status: DC

## 2022-01-10 NOTE — Progress Notes (Signed)
Patient referred by [name], MD for atrial fibrillation  Subjective:   Robert Horne, male    DOB: 28-Oct-1941, 81 y.o.   MRN: [id number]   Chief Complaint  Patient presents with   Coronary Artery Disease   Follow-up    4 weeks     HPI   81 year old Caucasian male with CAD, remote h/o PCI, hypothyroidism, prostate cancer, brief A. fib episode during [place] hospitalization in 10/2021  Patient is doing well.  He has not had any recent chest pain, palpitations, shortness of breath etc.  Event monitor results to the patient, details below.  Blood pressure is well controlled.  Office note 11/2021: Patient had brief Afib episode for <24 hrs, self converted to sinus rhythm. I had not seen the patient during this hospitalization, but had provided following recommendations pending outpatient follow up. EF was normal on echocardiogram. Although CHA2DS2VASc score is high at 5, I felt it was okay to omit anticoagulation given very short duration in the setting of COVID, and continue DAPT as his baseline TIA management. Will need to investigate what duration if DAPT is required from h/o TIA standpoint. After COVID isolation is completed, will arrange outpatient office visit and monitor.    Patient is here for hospital follow-up visit today.  He and his wife moved from [place] to [place] to be at [employer].  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  He has known CAD with LAD stent in 2003, with subsequent caths showing nonobstructive disease, most recently in 2014.  Presently, he stays active with walking, taking care of his wife as primary caregiver.  He denies any complaints of chest pain, shortness of breath.  He also denies any palpitations and did not feel any symptoms during his short lasting episode of A. fib during [place] hospitalization in 10/2021.    Patient had an episode of TIA in 2017, where he had difficulty  reading newspaper worse.  He has been on aspirin and Plavix since then.  Most recently, he underwent MRI brain in 07/2021, that showed chronic microvascular changes, but no evidence of prior stroke.  Patient has not had lipid panel in over a year.  Previously, he did not tolerate Crestor due to myalgias, and is therefore on pravastatin.   Current Outpatient Medications on File Prior to Visit  Medication Sig Dispense Refill   amLODipine (NORVASC) 2.5 MG tablet Take 2.5 mg by mouth daily.     Cholecalciferol (DIALYVITE VITAMIN D 5000) 125 MCG (5000 UT) capsule Take 5,000 Units by mouth daily.     clopidogrel (PLAVIX) 75 MG tablet Take 75 mg by mouth daily.     esomeprazole (NEXIUM) 20 MG capsule Take 20 mg by mouth daily at 12 noon.     levothyroxine (SYNTHROID) 50 MCG tablet Take 50 mcg by mouth daily before breakfast.     pravastatin (PRAVACHOL) 10 MG tablet Take 10 mg by mouth daily.     No current facility-administered medications on file prior to visit.    Cardiovascular and other pertinent studies:  Mobile cardiac telemetry 13 days 12/13/2021 - 12/27/2021: Dominant rhythm: Sinus. HR 40-130 bpm. Avg HR 69 bpm, in sinus rhythm with first degree AV block. 12 episodes of SVT, fastest at 154 bpm for 8 beats, longest for 12 beats at 114 bpm. <1% isolated SVE, couplet/triplets. 3 episodes listed as VT, more likely to be SVT with aberrent conduction, fastest at 190 bpm for 5  beats, longest for 19 beats at 113 bpm. <1% isolated VE, couplet/triplets. No atrial fibrillation/atrial flutter/high grade AV block, sinus pause >3sec noted. 1 patient triggered event, correlated with sinus rhythm.    EKG 12/13/2021: Sinus rhythm 95 bpm Possible old anteroseptal infarct  Echocardiogram 11/18/2021: 1. Left ventricular ejection fraction, by estimation, is 60 to 65%. The  left ventricle has normal function. The left ventricle has no regional  wall motion abnormalities.   2. Right ventricular systolic  function is normal. The right ventricular  size is normal. There is normal pulmonary artery systolic pressure.   3. The mitral valve is myxomatous. Trivial mitral valve regurgitation. No  evidence of mitral stenosis. There is mild late systolic prolapse of both  leaflets of the mitral valve.   4. The aortic valve is normal in structure. Aortic valve regurgitation is  not visualized. No aortic stenosis is present.   5. The inferior vena cava is normal in size with greater than 50%  respiratory variability, suggesting right atrial pressure of 3 mmHg.    MRI brain 07/30/2021: 1. Mild white matter disease, likely ischemic and due to microvascular disease. This has  progressed very slightly since the prior MRI.  2. Mild mucosal thickening ethmoid air cells bilaterally.  Recent labs: 11/18/2021: Glucose 111, BUN/Cr 10/0.93. EGFR >60. Na/K 134/3.7.  H/H 13/38. MCV 90. Platelets 278 HbA1C NA Chol 141, TG 61, HDL 42, LDL 87 TSH NA  06/05/2020: Chol 207, TG 69, HDL 55, LDL 138 ALKP 120.   Review of Systems  Cardiovascular:  Negative for chest pain, dyspnea on exertion, leg swelling, palpitations and syncope.        Vitals:   01/10/22 1314  BP: 119/64  Pulse: 67  Resp: 16  Temp: 98.2 F (36.8 C)  SpO2: 96%     Body mass index is 24.1 kg/m. Filed Weights   01/10/22 1314  Weight: 172 lb 12.8 oz (78.4 kg)     Objective:   Physical Exam Vitals and nursing note reviewed.  Constitutional:      General: He is not in acute distress. Neck:     Vascular: No JVD.  Cardiovascular:     Rate and Rhythm: Normal rate and regular rhythm.     Heart sounds: Normal heart sounds. No murmur heard. Pulmonary:     Effort: Pulmonary effort is normal.     Breath sounds: Normal breath sounds. No wheezing or rales.  Musculoskeletal:     Right lower leg: No edema.     Left lower leg: No edema.          Assessment & Recommendations:   81 year old Caucasian male with CAD, remote h/o  PCI, hypothyroidism, prostate cancer, brief A. fib episode during COVID hospitalization in 10/2021  Afib: While chest Vascor is 5, he has not had any other known A-fib episodes, other than brief episode <24 hrs during [place] hospitalization in 10/2021. No A-fib seen on to be cardiac telemetry.  I have encouraged him to consider wearing a smart watch or home monitoring.   Okay to hold off anticoagulation, unless recurrent Afib.  CAD: Remote history. No angina symptoms. LDL 87 on pravastatin 10 mg daily.  Changed to Lipitor 10 mg daily.  He has had myalgias with Crestor before. H/o TIA, prior CAD.  Continue monotherapy with Plavix without aspirin.  H/o TIA: Remote history. MRI brain 07/2021 showed no stroke, shows chronic microvascular changes. I do not think he needs DAPT. Continue plavix.   Check lipid panel  in May 2023.  F/u in 6 months   Nigel Mormon, MD Pager: [phone] Office: [phone]

## 2022-01-24 ENCOUNTER — Other Ambulatory Visit: Payer: Self-pay | Admitting: Family Medicine

## 2022-01-24 DIAGNOSIS — R1031 Right lower quadrant pain: Secondary | ICD-10-CM

## 2022-01-28 ENCOUNTER — Ambulatory Visit (HOSPITAL_BASED_OUTPATIENT_CLINIC_OR_DEPARTMENT_OTHER)
Admission: RE | Admit: 2022-01-28 | Discharge: 2022-01-28 | Disposition: A | Discharge status: Home / Self Care | Payer: Medicare Other | Source: Ambulatory Visit | Attending: Family Medicine | Admitting: Family Medicine

## 2022-01-28 ENCOUNTER — Other Ambulatory Visit: Payer: Self-pay

## 2022-01-28 ENCOUNTER — Other Ambulatory Visit (HOSPITAL_BASED_OUTPATIENT_CLINIC_OR_DEPARTMENT_OTHER): Payer: Self-pay | Admitting: Family Medicine

## 2022-01-28 ENCOUNTER — Encounter (HOSPITAL_BASED_OUTPATIENT_CLINIC_OR_DEPARTMENT_OTHER): Payer: Self-pay

## 2022-01-28 ENCOUNTER — Inpatient Hospital Stay: Admission: RE | Admit: 2022-01-28 | Payer: Medicare Other | Source: Ambulatory Visit

## 2022-01-28 DIAGNOSIS — R1031 Right lower quadrant pain: Secondary | ICD-10-CM | POA: Insufficient documentation

## 2022-01-28 MED ORDER — IOHEXOL 300 MG/ML  SOLN
100.0000 mL | Freq: Once | INTRAMUSCULAR | Status: AC | PRN
  Administered 2022-01-28: 75 mL via INTRAVENOUS

## 2022-02-13 ENCOUNTER — Ambulatory Visit: Payer: Medicare Other | Admitting: Nurse Practitioner

## 2022-02-14 ENCOUNTER — Ambulatory Visit: Payer: Medicare Other | Admitting: Nurse Practitioner

## 2022-02-14 ENCOUNTER — Other Ambulatory Visit: Payer: Self-pay

## 2022-02-20 ENCOUNTER — Ambulatory Visit (INDEPENDENT_AMBULATORY_CARE_PROVIDER_SITE_OTHER): Payer: Medicare Other | Admitting: Nurse Practitioner

## 2022-02-20 ENCOUNTER — Encounter: Payer: Self-pay | Admitting: Nurse Practitioner

## 2022-02-20 VITALS — BP 120/60 | HR 48 | Ht 71.0 in | Wt 169.0 lb

## 2022-02-20 DIAGNOSIS — K5909 Other constipation: Secondary | ICD-10-CM

## 2022-02-20 DIAGNOSIS — R1031 Right lower quadrant pain: Secondary | ICD-10-CM

## 2022-02-20 DIAGNOSIS — I2510 Atherosclerotic heart disease of native coronary artery without angina pectoris: Secondary | ICD-10-CM

## 2022-02-20 MED ORDER — LUBIPROSTONE 8 MCG PO CAPS: 8.0000 ug | ORAL_CAPSULE | Freq: Two times a day (BID) | ORAL | 0 refills | Status: DC

## 2022-02-20 NOTE — Progress Notes (Signed)
? ? ?ASSESSMENT :   ? ?Patient profile:  ?Robert Horne is a 81 y.o. male known new to practice, referred by PCP at [facility] for weight loss, constipation and abdominal pain.  Past medical history significant for hypothyroidism, HTN, HLD, ? TIA ( on plavix), prostate cancer. See PMH below for any additional history ? ?# Chronic constipation / RLQ pain ( 6 months), nearly constant but improves with defecation - Recent CT scan unremarkable. He sometimes feels a bulge in RLQ when coughing but no mention of hernia on CT scan. Pain possibly due in part to constipation. Also consider musculoskeletal origin ? ?# Unexplained weight loss. Reports ~ 30 pounds over last 6 months. The best I can tell he is down about 17 pounds.  He was 186 pounds in August [facility]), down to 169 today. Still,  a significant amount.  ? ?PLAN:    ? ?Miralax bowel purge  ?Trial Amitiza 8 mcg BID ?Increase water to 60 oz day ?If constipation not improving on Amitiza after 10 days then add 1 capful of miralax.daily ?Will try to get colonoscopy report from [facility] ? ? ?History of Present Illness:  ? ?Chief complaint:  right lower abdominal pain ? ?RLQ pain started about 6 months ago. It is nearly constant. He sometimes notices a bulge in the area. Pain gets worse with coughing but otherwise no relationship to movement / position. The RLQ pain seems to get better with defecation. No urinary symptoms. Robert Horne gives a history of chronic constipation characterized by decreased frequency of urge and hard stool. He can go days or even weeks without a BM.  About every 3 days he takes 1 capful of Miralax and 1-2 Dulcolax. Following that he has 3-4 episodes of large volume diarrhea. However, taking a dose of Miralax every day doesn't really help. He tried Linzess for two months but it didn't help and was costly. No blood in his stool.  ?  ?His last colonoscopy was in [place] 3-4 years ago. Told he had polyps but would not need  another one due to age.  ? ?He complains of fatigue. Wife has health problems and has been having frequent falls. He is up with her some during the night likely contributing to his fatigue. Hgb in late December was normal.  ? ?He has had some unintentional weight loss over the last few months. He is down 9 pounds. CTAP w/ contrast to evaluate weight loss on 3/3 was unrevealing. Pancreas, stomach, bowels okay.  ? ? ?Previous Labs / Imaging:: ? ?  Latest Ref Rng & Units 11/19/2021  ?  3:28 AM 11/18/2021  ?  4:22 AM 11/17/2021  ?  4:03 PM  ?CBC  ?WBC 4.0 - 10.5 K/uL 7.4   8.9   12.0    ?Hemoglobin 13.0 - 17.0 g/dL 13.2   11.7   12.6    ?Hematocrit 39.0 - 52.0 % 38.9   34.2   38.6    ?Platelets 150 - 400 K/uL 278   300   356    ? ? ?No results found for: LIPASE ? ?  Latest Ref Rng & Units 11/18/2021  ?  4:22 AM 11/17/2021  ?  4:03 PM 10/28/2021  ? 11:30 AM  ?CMP  ?Glucose 70 - 99 mg/dL 111   115   109    ?BUN 8 - 23 mg/dL '10   8   13    ' ?Creatinine 0.61 - 1.24 mg/dL 0.93  0.93   0.89    ?Sodium 135 - 145 mmol/L 134   134   139    ?Potassium 3.5 - 5.1 mmol/L 3.7   3.7   4.4    ?Chloride 98 - 111 mmol/L 104   102   107    ?CO2 22 - 32 mmol/L '19   23   26    ' ?Calcium 8.9 - 10.3 mg/dL 8.2   8.9   9.2    ?Total Protein 6.5 - 8.1 g/dL  7.1     ?Total Bilirubin 0.3 - 1.2 mg/dL  0.9     ?Alkaline Phos 38 - 126 U/L  141     ?AST 15 - 41 U/L  24     ?ALT 0 - 44 U/L  25     ? ? ?Labs from PCP done Feb 2023 ?Alk ph 151, Liver tests o/w normal.  ?TSH normal.  ? ? ?Imaging:  ?CT ABDOMEN PELVIS W CONTRAST ?CLINICAL DATA:  Unintentional weight loss. Remote history of ?prostate cancer. Diffuse abdominal pain. ? ?EXAM: ?CT ABDOMEN AND PELVIS WITH CONTRAST ? ?TECHNIQUE: ?Multidetector CT imaging of the abdomen and pelvis was performed ?using the standard protocol following bolus administration of ?intravenous contrast. ? ?RADIATION DOSE REDUCTION: This exam was performed according to the ?departmental dose-optimization program which  includes automated ?exposure control, adjustment of the mA and/or kV according to ?patient size and/or use of iterative reconstruction technique. ? ?CONTRAST:  88m OMNIPAQUE IOHEXOL 300 MG/ML  SOLN ? ?COMPARISON:  None. ? ?FINDINGS: ?Lower chest: No acute abnormality. ? ?Hepatobiliary: No focal hepatic abnormality. Gallbladder ?unremarkable. ? ?Pancreas: No focal abnormality or ductal dilatation. ? ?Spleen: No focal abnormality.  Normal size. ? ?Adrenals/Urinary Tract: Left renal pelvis is patulous and extends ?below the left kidney. Calices are nondilated as is the ureter. This ?may reflect extrarenal pelvis or changes from prior obstruction. No ?current evidence of obstruction. No renal or adrenal mass. Urinary ?bladder unremarkable. ? ?Stomach/Bowel: Normal appendix. Stomach, large and small bowel ?grossly unremarkable. ? ?Vascular/Lymphatic: Scattered aortic atherosclerosis. No evidence of ?aneurysm or adenopathy. ? ?Reproductive: Radiation seeds in the prostate.  No visible mass. ? ?Other: No free fluid or free air. ? ?Musculoskeletal: No acute bony abnormality. ? ?IMPRESSION: ?No acute findings in the abdomen or pelvis. ? ?Aortic atherosclerosis. ? ?Electronically Signed ?  By: KRolm BaptiseM.D. ?  On: 01/29/2022 07:19 ? ?Ultrasound reportedly negative in February 2023.  ? ?Past Medical History:  ?Diagnosis Date  ? Cancer ([facility]   ? prostate, 2002  ? H/O heart artery stent   ? Hypothyroidism   ? ?Past Surgical History:  ?Procedure Laterality Date  ? BACK SURGERY    ? CARDIAC CATHETERIZATION    ? CATARACT EXTRACTION Bilateral   ? PROSTATE SURGERY    ? REVERSE SHOULDER ARTHROPLASTY Right 11/06/2021  ? Procedure: REVERSE SHOULDER ARTHROPLASTY;  Surgeon: VHiram Gash MD;  Location: [facility];  Service: Orthopedics;  Laterality: Right;  ? TONSILLECTOMY    ? ?Family History  ?Problem Relation Age of Onset  ? Stroke Sister 810 ? ?Social History  ? ?Tobacco Use  ? Smoking status: Former  ?  Packs/day: 1.00  ?  Years:  8.00  ?  Pack years: 8.00  ?  Types: Cigarettes  ?  Quit date: 10/24/1970  ?  Years since quitting: 51.3  ? Smokeless tobacco: Never  ?Vaping Use  ? Vaping Use: Never used  ?Substance Use Topics  ? Alcohol use: Yes  ?  Comment: occ  ? Drug use: Never  ? ?Current Outpatient Medications  ?Medication Sig Dispense Refill  ? amLODipine (NORVASC) 2.5 MG tablet Take 2.5 mg by mouth daily.    ? Cholecalciferol (DIALYVITE VITAMIN D 5000) 125 MCG (5000 UT) capsule Take 5,000 Units by mouth daily.    ? clopidogrel (PLAVIX) 75 MG tablet Take 75 mg by mouth daily.    ? esomeprazole (NEXIUM) 20 MG capsule Take 20 mg by mouth daily at 12 noon.    ? levothyroxine (SYNTHROID) 75 MCG tablet Take 75 mcg by mouth daily.    ? pravastatin (PRAVACHOL) 10 MG tablet Take 10 mg by mouth daily.    ? thyroid (ARMOUR) 60 MG tablet Take 60 mg by mouth daily before breakfast.    ? ?No current facility-administered medications for this visit.  ? ?No Known Allergies ? ? ?Review of Systems: ?Positive for frequent urination( chronic), fatigue.  All other systems reviewed and negative except where noted in HPI.  ? ?Physical Exam:   ? ?Wt Readings from Last 3 Encounters:  ?02/20/22 169 lb (76.7 kg)  ?01/10/22 172 lb 12.8 oz (78.4 kg)  ?12/13/21 172 lb (78 kg)  ? ? ?BP 120/60   Pulse (!) 48   Ht '5\' 11"'  (1.803 m)   Wt 169 lb (76.7 kg)   BMI 23.57 kg/m?  ?Constitutional:  Generally well appearing male in no acute distress. ?Psychiatric: Pleasant. Normal mood and affect. Behavior is normal. ?EENT: Pupils normal.  Conjunctivae are normal. No scleral icterus. ?Neck supple.  ?Cardiovascular: Normal rate, regular rhythm. No edema ?Pulmonary/chest: Effort normal and breath sounds normal. No wheezing, rales or rhonchi. ?Abdominal: Soft, nondistended, nontender. Bowel sounds active throughout. There are no masses palpable. No hepatomegaly. ?Neurological: Alert and oriented to person place and time. ?Skin: Skin is warm and dry. No rashes noted. ? ?Tye Savoy, NP  02/20/2022, 2:06 PM ? ?Cc:  ?Referring Provider ?Sheryle Hail, NP ? ? ? ? ? ? ? ?

## 2022-02-20 NOTE — Patient Instructions (Addendum)
Trial Amitiza 8 mcg twice daily ?Increase water to 60 oz day ?If constipation not improving on Amitiza after 10 days then add 1 capful of miralax.daily ? ?  Miralax bowel purge (to clean out your bowels). Please do the following: ?Purchase a bottle of Miralax over the counter as well as a box of 5 mg dulcolax tablets. ?Take 4 dulcolax tablets. ?Wait 1 hour. ?You will then drink 6-8 capfuls of Miralax mixed in an adequate amount of water/juice/gatorade (you may choose which of these liquids to drink) over the next 2-3 hours. ?You should expect results within 1 to 6 hours after completing the bowel purge. ? ?We have scheduled you a follow up with Tye Savoy, NP on 03/13/22 at 11:30am. ? ?Thank you for trusting me with your gastrointestinal care!   ? ?Tye Savoy, NP ? ? ? ? ?BMI: ? ?If you are age 61 or older, your body mass index should be between 23-30. Your Body mass index is 23.57 kg/m?Marland Kitchen If this is out of the aforementioned range listed, please consider follow up with your Primary Care Provider. ? ?If you are age 85 or younger, your body mass index should be between 19-25. Your Body mass index is 23.57 kg/m?Marland Kitchen If this is out of the aformentioned range listed, please consider follow up with your Primary Care Provider.  ? ?MY CHART: ? ?The [place] [facility] providers would like to encourage you to use [facility] to communicate with providers for non-urgent requests or questions.  Due to long hold times on the telephone, sending your provider a message by [facility] may be a faster and more efficient way to get a response.  Please allow 48 business hours for a response.  Please remember that this is for non-urgent requests.  ? ?

## 2022-02-21 ENCOUNTER — Encounter: Payer: Self-pay | Admitting: Nurse Practitioner

## 2022-02-25 ENCOUNTER — Telehealth: Payer: Self-pay

## 2022-02-25 NOTE — Telephone Encounter (Signed)
Fax received patient insurance only covered 30 day supply of Lubiprostone. Formulary exception must be sent in ?

## 2022-02-25 NOTE — Progress Notes (Signed)
Agree with the assessment and plan as outlined by Paula Guenther, NP.  Viva Gallaher E. Cecilia Vancleve, MD [facility]  

## 2022-03-13 ENCOUNTER — Encounter: Payer: Self-pay | Admitting: Nurse Practitioner

## 2022-03-13 ENCOUNTER — Ambulatory Visit (INDEPENDENT_AMBULATORY_CARE_PROVIDER_SITE_OTHER): Payer: Medicare Other | Admitting: Nurse Practitioner

## 2022-03-13 VITALS — BP 102/62 | HR 53 | Ht 71.0 in | Wt 166.2 lb

## 2022-03-13 DIAGNOSIS — K5909 Other constipation: Secondary | ICD-10-CM | POA: Diagnosis not present

## 2022-03-13 DIAGNOSIS — I2510 Atherosclerotic heart disease of native coronary artery without angina pectoris: Secondary | ICD-10-CM | POA: Diagnosis not present

## 2022-03-13 DIAGNOSIS — R1030 Lower abdominal pain, unspecified: Secondary | ICD-10-CM | POA: Diagnosis not present

## 2022-03-13 MED ORDER — LUBIPROSTONE 8 MCG PO CAPS: 8.0000 ug | ORAL_CAPSULE | Freq: Two times a day (BID) | ORAL | 2 refills | Status: DC

## 2022-03-13 MED ORDER — DICYCLOMINE HCL 10 MG PO CAPS: ORAL_CAPSULE | ORAL | 2 refills | Status: DC

## 2022-03-13 NOTE — Patient Instructions (Addendum)
If you are age 83 or older, your body mass index should be between 23-30. Your Body mass index is 23.18 kg/m?Marland Kitchen If this is out of the aforementioned range listed, please consider follow up with your Primary Care Provider. ?________________________________________________________ ? ?The [place] [facility] providers would like to encourage you to use [facility] to communicate with providers for non-urgent requests or questions.  Due to long hold times on the telephone, sending your provider a message by [facility] may be a faster and more efficient way to get a response.  Please allow 48 business hours for a response.  Please remember that this is for non-urgent requests.  ?_______________________________________________________ ? ?We have sent the following medications to your pharmacy for you to pick up at your convenience: ? ?START: Dicyclomine 10 mg one to two times daily as needed for right lower quadrant pain. # 60 , 2 refills ? ?CONTINUE: Amitizia 70mg one tablet twice daily ? ?Once constipation and abdominal pain ar improve, if still have no appetite / losing weight then please discuss depression with PCP.  ? ?You are scheduled to follow up on 04-22-22 at 11:30am. ? ?Thank you for entrusting me with your care and [facility] ? ?PAlen Bleacher NP  ? ? ?

## 2022-03-13 NOTE — Progress Notes (Signed)
?[facility] apparently less expesive. Will call Amitiza to [facility] ? ? ?Assessment  ? ?Patient Profile:  ?Robert Horne is a 81 y.o. male known to Dr. Candis Schatz with a past medical history of hypothyroidism, HTN, HLD, ? TIA ( on plavix), prostate cancer.  Additional medical history as listed in [place] . ? ?Constipation. He wasn't able to get Amitiza. Out of pocket cost was high.  ? ?RLQ pain, worse with increased intestinal gas. Recent CTAP w/ contrast for abdominal pain and weight loss was unrevealing. Pain improves with expelling of gas. Still wonder about musculoskeletal component.  ? ?Weight loss, mainly due to poor appetite. He admits to depression. Wife is ill, not doing well.  ? ?Depression ? ?Plan  ? ?Trial of Dicyclomine 10 mg one to two times daily as needed # 60 , 2 refills ? ?Amitiza 8 mcg BID # 60 3 refills. With [facility] I am told [facility] has it for ~ 40 dollars as opposed to the 350 dollars patient was quoted elsewhere.  ? ?Hopefully constipation and abdominal pain will improve. If so and he continues to have poor appetite then he will discuss depression with PCP. I told him that just because Remeron didn't work doesn't mean a different class of anti-depressants will not be effective.  ? ?Follow up with me in 4-6 weeks ? ?I didn't get colonoscopy report from [place], will try again.  ? ?History of Present Illness  ? ?Chief Complaint : follow up on constipation.  ? ?02/20/22 ?Established care. Came for evaluation of constipation, abdominal pain and RLQ pain. Given bowel purge and was to start Amitiza.  ?Records requested from GI in [place].  ? ?INTERVAL HISTORY ?Did bowel purge which worked great. Amitiza wasn't covered by insurance and too expensive out of pocket He started daily Miralax which worked well for a few days but then he developed too loose of stools and excessive gas. He continues to have frequent burning / stabbing RLQ radiating across lower abdomen. It get worse when he has a lot of intestinal  gas. Passage of gas does help. Gas-x doesn't help. He stopped Miralax about two weeks ago. He wants to know if we are able to help him get Amitiza ? ?Weight loss ?Recent CT scan for weight loss and abdominal pain was unrevealing. He has lost another 3 pounds since I saw him late March. He has no dysphagia, nausea / vomiting but basically just doesn't have an appetite. He admits to depression surrounding wife's health.  He tried Remeron for 6 months but didn't see improvement. He stays in contact with his therapist.   ? ?Laboratory data: ? ?  Latest Ref Rng & Units 11/17/2021  ?  4:03 PM  ?Hepatic Function  ?Total Protein 6.5 - 8.1 g/dL 7.1    ?Albumin 3.5 - 5.0 g/dL 3.5    ?AST 15 - 41 U/L 24    ?ALT 0 - 44 U/L 25    ?Alk Phosphatase 38 - 126 U/L 141    ?Total Bilirubin 0.3 - 1.2 mg/dL 0.9    ? ? ? ?  Latest Ref Rng & Units 11/19/2021  ?  3:28 AM 11/18/2021  ?  4:22 AM 11/17/2021  ?  4:03 PM  ?CBC  ?WBC 4.0 - 10.5 K/uL 7.4   8.9   12.0    ?Hemoglobin 13.0 - 17.0 g/dL 13.2   11.7   12.6    ?Hematocrit 39.0 - 52.0 % 38.9   34.2   38.6    ?  Platelets 150 - 400 K/uL 278   300   356    ? ? ?Imaging:  ?CT ATP 01/28/22 ?IMPRESSION: ?No acute findings in the abdomen or pelvis. ? Aortic atherosclerosis. ?  ?Past Medical History:  ?Diagnosis Date  ? Cancer [facility])   ? prostate, 2002  ? H/O heart artery stent   ? Hypothyroidism   ? ? ?Past Surgical History:  ?Procedure Laterality Date  ? BACK SURGERY    ? CARDIAC CATHETERIZATION    ? CATARACT EXTRACTION Bilateral   ? PROSTATE SURGERY    ? REVERSE SHOULDER ARTHROPLASTY Right 11/06/2021  ? Procedure: REVERSE SHOULDER ARTHROPLASTY;  Surgeon: Hiram Gash, MD;  Location: [facility];  Service: Orthopedics;  Laterality: Right;  ? TONSILLECTOMY    ? ? ?Current Medications, Allergies, Family History and Social History were reviewed in [employer] record. ?  ?  ?Current Outpatient Medications  ?Medication Sig Dispense Refill  ? amLODipine (NORVASC) 2.5 MG tablet Take 2.5  mg by mouth daily.    ? Cholecalciferol (DIALYVITE VITAMIN D 5000) 125 MCG (5000 UT) capsule Take 5,000 Units by mouth daily.    ? clopidogrel (PLAVIX) 75 MG tablet Take 75 mg by mouth daily.    ? esomeprazole (NEXIUM) 20 MG capsule Take 20 mg by mouth daily at 12 noon.    ? levothyroxine (SYNTHROID) 75 MCG tablet Take 75 mcg by mouth daily.    ? pravastatin (PRAVACHOL) 10 MG tablet Take 10 mg by mouth daily.    ? thyroid (ARMOUR) 60 MG tablet Take 60 mg by mouth daily before breakfast.    ? ?No current facility-administered medications for this visit.  ? ? ?Review of Systems: ?No chest pain. No shortness of breath. No urinary complaints.  ? ? ?Physical Exam  ? ?Wt Readings from Last 3 Encounters:  ?03/13/22 166 lb 3.2 oz (75.4 kg)  ?02/20/22 169 lb (76.7 kg)  ?01/10/22 172 lb 12.8 oz (78.4 kg)  ? ? ?BP 102/62   Pulse (!) 53   Ht 5' 11" (1.803 m)   Wt 166 lb 3.2 oz (75.4 kg)   SpO2 98%   BMI 23.18 kg/m?  ?Constitutional:  Generally well appearing male in no acute distress. ?Psychiatric: Pleasant. Normal mood and affect. Behavior is normal. ?EENT: Pupils normal.  Conjunctivae are normal. No scleral icterus. ?Neck supple.  ?Cardiovascular: Normal rate, regular rhythm. No edema ?Pulmonary/chest: Effort normal and breath sounds normal. No wheezing, rales or rhonchi. ?Abdominal: Soft, nondistended, nontender. Bowel sounds active throughout. There are no masses palpable. No hepatomegaly. ?Neurological: Alert and oriented to person place and time. ?Skin: Skin is warm and dry. No rashes noted. ? ?Tye Savoy, NP  03/13/2022, 11:38 AM ? ? ? ? ? ? ? ? ? ?

## 2022-03-13 NOTE — Progress Notes (Signed)
Agree with the assessment and plan as outlined by Paula Guenther, NP.  Artisha Capri E. Janea Schwenn, MD [facility]  

## 2022-04-22 ENCOUNTER — Encounter: Payer: Self-pay | Admitting: Nurse Practitioner

## 2022-04-22 ENCOUNTER — Telehealth: Payer: Self-pay | Admitting: Nurse Practitioner

## 2022-04-22 ENCOUNTER — Ambulatory Visit (INDEPENDENT_AMBULATORY_CARE_PROVIDER_SITE_OTHER): Payer: Medicare Other | Admitting: Nurse Practitioner

## 2022-04-22 VITALS — BP 120/80 | HR 68 | Ht 69.25 in | Wt 166.0 lb

## 2022-04-22 DIAGNOSIS — I2510 Atherosclerotic heart disease of native coronary artery without angina pectoris: Secondary | ICD-10-CM | POA: Diagnosis not present

## 2022-04-22 DIAGNOSIS — K5900 Constipation, unspecified: Secondary | ICD-10-CM | POA: Diagnosis not present

## 2022-04-22 NOTE — Telephone Encounter (Signed)
Inbound call from patient stating he seen Nevin Bloodgood this morning and she wanted him to get the name of where he had his last colonoscopy.  [place]  Phone number: [phone]   Ext [phone]  Dr Nurse: Caren Griffins   Dr. Name: Dr. Lyanne Co (Dr. Ellison Hughs)

## 2022-04-22 NOTE — Progress Notes (Unsigned)
Assessment   Patient Profile:  Robert Horne is a 81 y.o. male known to Dr. Candis Schatz with a past medical history of hypothyroidism, HTN, HLD, ? TIA ( on plavix), prostate cancer.  Additional medical history as listed in [place] .  RLQ pain,  chronic constipation. Some results with Amitiza. Abdominal pain much improved with daily Bentyl.   Having one loose BM about every 4 days with Amitiza regardless if he takes it once or twice a day. This is an improvement in frequency but is still not adequate. He describes occasional fecal impactions which he thinks is due to a hernia causing an intermittent obstructive process. CTAP in March was unrevealing. His constipation may be partly due to  pelvic floor dysfunction  Plan   Still need to get a copy of the colonoscopy he reports having had done in [place] about one year ago.  Will re-request He will continue taking Amitiza 8 mcg daily ( doesn't have to take BID if he doesn't feel it is beneficial).  On the days that he doesn't have a BM he will use a Glycerin suppository to see if that will trigger a bowel movement.  Consider sitz marker study to see evaluate colonic transit though I still think pelvic floor dysfunction is very possible. Pelvic floor PT may be helpful.  He will follow up with Dr. Candis Schatz in a few weeks.    HPI   Chief Complaint : Amitiza helps but causes a loose BM and still having only once BM about every 4 days.   Robert Horne was seen at the end of March as a new patient for evaluation of weight loss, constipation and abdominal pain.  Constipation was defined by decreased frequency of urge and hard stools. Miralax nor linzess had helped . Prior CT scan was unremarkable.  He was advised to increase water intake and given a trial of Amitiza while we awaited last colonoscopy report from [facility] ( supposedly done within the last year).    03/13/22 follow-up appointment He found that he could get Amitiza at [facility] only about  $117. 00.  We had not gotten the colonoscopy report from [place], we put in another requestHe was not able to get the Amitiza, out-of-pocket cost was too much.  He was still having right lower quadrant pain which would improve with passage of flatus.  There was question of a musculoskeletal component to the pain.  His wife has been ill, he was struggling with depression and thought that could be contributing to his poor appetite and weight loss.  We heard he could be Amitiza at [facility] for less cost so he left with plans to try the medications. We re-requested colonoscopy report from [place].     Interval history :  He found Amitiza at [facility] for $117. 00.  He is taking the Amitiza gives gives him a loose BM but then no BM for about 4 days. This is the same whether he takes it once or twice a day so currently only taking one dose a day. He can tolerate the loose BM but would like to have a BM more often. Once every few months he feels like his rectum is obstructed with stool. When this happens, he can feel a large ball of stool in rectum on self digital exam. The ball of stools is just off to the left side of the rectum.  When this occurs a suppository nor enema will help. This has been happening for years, he  wonders about a hernia intermittently obstructing his bowels.   He is taking Dicyclomine once daily and hasn't had any significant RLQ pain since and is please with this. No longer losing weight. His appetite is good but he doesn't make time to eat. His wife is on home hospice.   Imaging   01/28/22 CT AP  with contrast for weight loss and abdominal pain  IMPRESSION: No acute findings in the abdomen or pelvis. Aortic atherosclerosis.    Labs:     Latest Ref Rng & Units 11/19/2021    3:28 AM 11/18/2021    4:22 AM 11/17/2021    4:03 PM  CBC  WBC 4.0 - 10.5 K/uL 7.4   8.9   12.0    Hemoglobin 13.0 - 17.0 g/dL 13.2   11.7   12.6    Hematocrit 39.0 - 52.0 % 38.9   34.2   38.6    Platelets 150  - 400 K/uL 278   300   356         Latest Ref Rng & Units 11/17/2021    4:03 PM  Hepatic Function  Total Protein 6.5 - 8.1 g/dL 7.1    Albumin 3.5 - 5.0 g/dL 3.5    AST 15 - 41 U/L 24    ALT 0 - 44 U/L 25    Alk Phosphatase 38 - 126 U/L 141    Total Bilirubin 0.3 - 1.2 mg/dL 0.01 Nov 2021 Hgb 13.2  Ferritin 197  Aug 2022 TSH 1.7   Past Medical History:  Diagnosis Date   Cancer [facility])    prostate, 2002   H/O heart artery stent    Hypothyroidism     Past Surgical History:  Procedure Laterality Date   BACK SURGERY     CARDIAC CATHETERIZATION     CATARACT EXTRACTION Bilateral    PROSTATE SURGERY     REVERSE SHOULDER ARTHROPLASTY Right 11/06/2021   Procedure: REVERSE SHOULDER ARTHROPLASTY;  Surgeon: Hiram Gash, MD;  Location: [facility];  Service: Orthopedics;  Laterality: Right;   TONSILLECTOMY      Current Medications, Allergies, Family History and Social History were reviewed in [employer] record.     Current Outpatient Medications  Medication Sig Dispense Refill   amLODipine (NORVASC) 2.5 MG tablet Take 2.5 mg by mouth daily.     Cholecalciferol (DIALYVITE VITAMIN D 5000) 125 MCG (5000 UT) capsule Take 5,000 Units by mouth daily.     clopidogrel (PLAVIX) 75 MG tablet Take 75 mg by mouth daily.     dicyclomine (BENTYL) 10 MG capsule Take one to two times daily as needed for right lower quadrant pain. 60 capsule 2   esomeprazole (NEXIUM) 20 MG capsule Take 20 mg by mouth daily at 12 noon.     levothyroxine (SYNTHROID) 75 MCG tablet Take 75 mcg by mouth daily.     lubiprostone (AMITIZA) 8 MCG capsule Take 1 capsule (8 mcg total) by mouth 2 (two) times daily with a meal. (Patient taking differently: Take 8 mcg by mouth daily.) 60 capsule 2   mirtazapine (REMERON) 45 MG tablet Take 45 mg by mouth at bedtime.     pravastatin (PRAVACHOL) 10 MG tablet Take 10 mg by mouth daily.     thyroid (ARMOUR) 60 MG tablet Take 60 mg by mouth daily before  breakfast.     No current facility-administered medications for this visit.    Review of Systems: No chest pain. No shortness  of breath. No urinary complaints.    Physical Exam  Wt Readings from Last 3 Encounters:  04/22/22 166 lb (75.3 kg)  03/13/22 166 lb 3.2 oz (75.4 kg)  02/20/22 169 lb (76.7 kg)    BP (!) 86/50 (BP Location: Left Arm, Patient Position: Sitting, Cuff Size: Normal)   Pulse 68   Ht 5' 9.25" (1.759 m) Comment: height measured without shoes  Wt 166 lb (75.3 kg)   BMI 24.34 kg/m  Constitutional:  Generally well appearing male in no acute distress. Psychiatric: Pleasant. Normal mood and affect. Behavior is normal. EENT: Pupils normal.  Conjunctivae are normal. No scleral icterus. Neck supple.  Cardiovascular: Normal rate, regular rhythm. No edema Pulmonary/chest: Effort normal and breath sounds normal. No wheezing, rales or rhonchi. Abdominal: Soft, nondistended, nontender. Bowel sounds active throughout. There are no masses palpable. No hepatomegaly. Rectal: No masses felt. Scant light brown stool in vault. Adequate pelvic floor descent.  Neurological: Alert and oriented to person place and time. Skin: Skin is warm and dry. No rashes noted.  Tye Savoy, NP  04/22/2022, 11:36 AM

## 2022-04-22 NOTE — Patient Instructions (Addendum)
Try glycerin suppositories to stimulate bowel movement. Can use daily if needed.   Continue Amitiza 1-2 times daily for constipation  Continue daily Bentyl for abdominal pain   If you are age 83 or older, your body mass index should be between 23-30. Your Body mass index is 24.34 kg/m. If this is out of the aforementioned range listed, please consider follow up with your Primary Care Provider.  If you are age 91 or younger, your body mass index should be between 19-25. Your Body mass index is 24.34 kg/m. If this is out of the aformentioned range listed, please consider follow up with your Primary Care Provider.   ________________________________________________________  The [facility] providers would like to encourage you to use [facility] to communicate with providers for non-urgent requests or questions.  Due to long hold times on the telephone, sending your provider a message by [facility] may be a faster and more efficient way to get a response.  Please allow 48 business hours for a response.  Please remember that this is for non-urgent requests.  _______________________________________________________   I appreciate the  opportunity to care for you  Thank You   [place]

## 2022-04-22 NOTE — Telephone Encounter (Signed)
Got It  Faxed release for records today

## 2022-04-28 NOTE — Progress Notes (Signed)
Agree with the assessment and plan as outlined by Paula Guenther, NP.  Alanis Clift E. Kees Idrovo, MD [facility]  

## 2022-05-07 NOTE — Telephone Encounter (Signed)
Refaxed still waiting on pts records

## 2022-05-20 ENCOUNTER — Encounter: Payer: Self-pay | Admitting: Gastroenterology

## 2022-05-20 ENCOUNTER — Ambulatory Visit (INDEPENDENT_AMBULATORY_CARE_PROVIDER_SITE_OTHER): Payer: Medicare Other | Admitting: Gastroenterology

## 2022-05-20 VITALS — BP 126/70 | HR 55 | Ht 70.0 in | Wt 167.0 lb

## 2022-05-20 DIAGNOSIS — K5900 Constipation, unspecified: Secondary | ICD-10-CM | POA: Diagnosis not present

## 2022-05-20 MED ORDER — SENNA 8.6 MG PO TABS: 2.0000 | ORAL_TABLET | Freq: Every day | ORAL | 3 refills | Status: DC

## 2022-05-20 MED ORDER — POLYETHYLENE GLYCOL 3350 17 GM/SCOOP PO POWD: 1.0000 | Freq: Every day | ORAL | 3 refills | Status: DC

## 2022-05-20 NOTE — Progress Notes (Signed)
HPI : Robert Horne is a very pleasant 81 year old male with a history of prostate cancer who has been followed by Tye Savoy in our office for ongoing issues with constipation.  He had been taking Amitiza, but he stopped taking this a few weeks ago because it was causing diarrhea, even when taking it once daily.  Currently he is not taken anything for his constipation.  He states he has had a bowel movement every 1 to 3 days.  His stools are hard, dry and small.  He has significant straining with bowel movements.  He is typically on the toilet for about 10 minutes. Constipation has been a problem for him for many years. He previously reports taking Linzess many years ago, which he says did not help him.  He does not member what dose he was on. He says taking MiraLAX once daily was not helpful for his constipation either. He symptoms uses glycerin suppositories, which she says does work well.  He typically uses this when its been more than 2 days without a bowel movement. He does not think he has tried Metamucil before.  He has never tried magnesium oxide or mag citrate. Dulcolax works, but is too harsh for him. Prune juice has not been helpful for him either.  The patient's wife passed away 2 weeks ago.  The patient is having a difficult time with grief in the wake of his loss. He has 2 daughters that live in [place] that come to visit every other weekend.  Past Medical History:  Diagnosis Date   Cancer [facility])    prostate, 2002   H/O heart artery stent    Hypothyroidism      Past Surgical History:  Procedure Laterality Date   BACK SURGERY     CARDIAC CATHETERIZATION     CATARACT EXTRACTION Bilateral    PROSTATE SURGERY     REVERSE SHOULDER ARTHROPLASTY Right 11/06/2021   Procedure: REVERSE SHOULDER ARTHROPLASTY;  Surgeon: Hiram Gash, MD;  Location: [facility];  Service: Orthopedics;  Laterality: Right;   TONSILLECTOMY     Family History  Problem Relation Age of Onset   Stroke  Sister 27   Colon cancer Neg Hx    Stomach cancer Neg Hx    Esophageal cancer Neg Hx    Social History   Tobacco Use   Smoking status: Former    Packs/day: 1.00    Years: 8.00    Total pack years: 8.00    Types: Cigarettes    Quit date: 10/24/1970    Years since quitting: 51.6   Smokeless tobacco: Never  Vaping Use   Vaping Use: Never used  Substance Use Topics   Alcohol use: Yes    Comment: occ   Drug use: Never   Current Outpatient Medications  Medication Sig Dispense Refill   amLODipine (NORVASC) 2.5 MG tablet Take 2.5 mg by mouth daily.     Cholecalciferol (DIALYVITE VITAMIN D 5000) 125 MCG (5000 UT) capsule Take 5,000 Units by mouth daily.     clopidogrel (PLAVIX) 75 MG tablet Take 75 mg by mouth daily.     esomeprazole (NEXIUM) 20 MG capsule Take 20 mg by mouth daily at 12 noon.     levothyroxine (SYNTHROID) 75 MCG tablet Take 75 mcg by mouth daily.     lubiprostone (AMITIZA) 8 MCG capsule Take 1 capsule (8 mcg total) by mouth 2 (two) times daily with a meal. (Patient taking differently: Take 8 mcg by mouth daily.) 60 capsule  2   mirtazapine (REMERON) 45 MG tablet Take 45 mg by mouth at bedtime.     pravastatin (PRAVACHOL) 10 MG tablet Take 10 mg by mouth daily.     thyroid (ARMOUR) 60 MG tablet Take 60 mg by mouth daily before breakfast.     dicyclomine (BENTYL) 10 MG capsule Take one to two times daily as needed for right lower quadrant pain. (Patient not taking: Reported on 05/20/2022) 60 capsule 2   No current facility-administered medications for this visit.   No Known Allergies   Review of Systems: All systems reviewed and negative except where noted in HPI.    No results found.  Physical Exam: BP 126/70   Pulse (!) 55   Ht '5\' 10"'$  (1.778 m)   Wt 167 lb (75.8 kg)   SpO2 95%   BMI 23.96 kg/m  Constitutional: Pleasant,well-developed, thin Caucasian male in no acute distress. Neurological: Alert and oriented to person place and time. Skin: Skin is warm  and dry. No rashes noted. Psychiatric: Flattened affect. Behavior is normal.  Patient is tearful when discussing the death of his wife.  CBC    Component Value Date/Time   WBC 7.4 11/19/2021 0328   RBC 4.29 11/19/2021 0328   HGB 13.2 11/19/2021 0328   HCT 38.9 (L) 11/19/2021 0328   PLT 278 11/19/2021 0328   MCV 90.7 11/19/2021 0328   MCH 30.8 11/19/2021 0328   MCHC 33.9 11/19/2021 0328   RDW 12.5 11/19/2021 0328   LYMPHSABS 0.7 11/17/2021 1603   MONOABS 1.2 (H) 11/17/2021 1603   EOSABS 0.0 11/17/2021 1603   BASOSABS 0.1 11/17/2021 1603    CMP     Component Value Date/Time   NA 134 (L) 11/18/2021 0422   K 3.7 11/18/2021 0422   CL 104 11/18/2021 0422   CO2 19 (L) 11/18/2021 0422   GLUCOSE 111 (H) 11/18/2021 0422   BUN 10 11/18/2021 0422   CREATININE 0.93 11/18/2021 0422   CALCIUM 8.2 (L) 11/18/2021 0422   PROT 7.1 11/17/2021 1603   ALBUMIN 3.5 11/17/2021 1603   AST 24 11/17/2021 1603   ALT 25 11/17/2021 1603   ALKPHOS 141 (H) 11/17/2021 1603   BILITOT 0.9 11/17/2021 1603   GFRNONAA >60 11/18/2021 0422     ASSESSMENT AND PLAN: 81 year old male with chronic constipation, that has been refractory to multiple different laxatives for different reasons.  Some laxatives are too harsh for him, while others are ineffective.  Although he has tried MiraLAX in the past, he only took it once a day.  I suggested he try to titrate dose of MiraLAX, which would help provide regular bowel movements without causing diarrhea.  I suggested he start taking MiraLAX 1 capful with every meal.  He has not tried senna.  I suggested he start using this as an as needed medication instead of Dulcolax, when he has gone 2 days without a bowel movement.  I also suggested he start incorporating kiwi fruit into his diet.  This has been shown in a recent study to improve constipation and global IBS symptoms.  The patient states he likes kiwi fruit and would start consuming this more  regularly.  Constipation -MiraLAX 3 times a day with meals -Senna 2 tabs when no BM in 2 days - More frequent kiwi fruit consumption - High-fiber diet, drink plenty of water -Follow-up in 6 weeks  Jasa Dundon E. Candis Schatz, MD [facility]  CC:  [name], MD

## 2022-07-10 ENCOUNTER — Ambulatory Visit: Payer: Medicare Other | Admitting: Cardiology

## 2022-07-11 ENCOUNTER — Ambulatory Visit: Payer: Medicare Other | Admitting: Cardiology

## 2022-08-25 ENCOUNTER — Ambulatory Visit: Payer: Medicare Other | Admitting: Cardiology

## 2022-08-25 ENCOUNTER — Encounter: Payer: Self-pay | Admitting: Cardiology

## 2022-08-25 VITALS — BP 135/82 | HR 67 | Temp 98.6°F | Resp 16 | Ht 70.0 in | Wt 171.6 lb

## 2022-08-25 DIAGNOSIS — I25118 Atherosclerotic heart disease of native coronary artery with other forms of angina pectoris: Secondary | ICD-10-CM

## 2022-08-25 MED ORDER — NITROGLYCERIN 0.4 MG SL SUBL: 0.4000 mg | SUBLINGUAL_TABLET | SUBLINGUAL | 3 refills | Status: DC | PRN

## 2022-08-25 NOTE — Progress Notes (Signed)
Patient referred by Robert Means, MD for atrial fibrillation  Subjective:   Robert Horne, male    DOB: [birth year], 81 y.o.   MRN: [id number]   Chief Complaint  Patient presents with   PSVT   Coronary Artery Disease   Follow-up     HPI   81 year old Caucasian male with CAD, remote h/o PCI, hypothyroidism, prostate cancer, brief A. fib episode during [facility] hospitalization in 10/2021  One week ago, patient was noted to be tired by his daughter. Blood pressure was noted to be elevated up to SBP 200 mmHg. EKG showed frequent PVCs. He was evaluated at [facility]. Workup was unremarkable. ACS was excluded. BP was normalized. Patient has done well sicne then, but did report one episode of chest pain today, that improved after 10 min of rest.   Office note 11/2021: Patient had brief Afib episode for <24 hrs, self converted to sinus rhythm. I had not seen the patient during this hospitalization, but had provided following recommendations pending outpatient follow up. EF was normal on echocardiogram. Although CHA2DS2VASc score is high at 5, I felt it was okay to omit anticoagulation given very short duration in the setting of COVID, and continue DAPT as his baseline TIA management. Will need to investigate what duration if DAPT is required from h/o TIA standpoint. After COVID isolation is completed, will arrange outpatient office visit and monitor.    Patient is here for hospital follow-up visit today.  He and his wife moved from [place] to [place] to be at [employer].  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  He has known CAD with LAD stent in 2003, with subsequent caths showing nonobstructive disease, most recently in 2014.  Presently, he stays active with walking, taking care of his wife as primary caregiver.  He denies any complaints of chest pain, shortness of breath.  He also denies any palpitations and did not  feel any symptoms during his short lasting episode of A. fib during COVID hospitalization in 10/2021.    Patient had an episode of TIA in 2017, where he had difficulty reading newspaper worse.  He has been on aspirin and Plavix since then.  Most recently, he underwent MRI brain in 07/2021, that showed chronic microvascular changes, but no evidence of prior stroke.  Patient has not had lipid panel in over a year.  Previously, he did not tolerate Crestor due to myalgias, and is therefore on pravastatin.   Current Outpatient Medications:    ALPRAZolam (XANAX) 1 MG tablet, Take 1 mg by mouth daily as needed., Disp: , Rfl:    amLODipine (NORVASC) 2.5 MG tablet, Take 2.5 mg by mouth daily., Disp: , Rfl:    atorvastatin (LIPITOR) 10 MG tablet, Take 10 mg by mouth daily., Disp: , Rfl:    Cholecalciferol (DIALYVITE VITAMIN D 5000) 125 MCG (5000 UT) capsule, Take 5,000 Units by mouth daily., Disp: , Rfl:    clopidogrel (PLAVIX) 75 MG tablet, Take 75 mg by mouth daily., Disp: , Rfl:    esomeprazole (NEXIUM) 20 MG capsule, Take 20 mg by mouth daily at 12 noon., Disp: , Rfl:    levothyroxine (SYNTHROID) 75 MCG tablet, Take 75 mcg by mouth daily before breakfast., Disp: , Rfl:    mirtazapine (REMERON) 45 MG tablet, Take 45 mg by mouth at bedtime., Disp: , Rfl:    pravastatin (PRAVACHOL) 10 MG tablet, Take 10 mg by mouth daily., Disp: ,  Rfl:    thyroid (ARMOUR) 60 MG tablet, Take 60 mg by mouth daily before breakfast., Disp: , Rfl:   Cardiovascular and other pertinent studies:  EKG 08/25/2022: Sinus rhythm 64 bpm Occasional PAC   Nonspecific T-abnormality  Mobile cardiac telemetry 13 days 12/13/2021 - 12/27/2021: Dominant rhythm: Sinus. HR 40-130 bpm. Avg HR 69 bpm, in sinus rhythm with first degree AV block. 12 episodes of SVT, fastest at 154 bpm for 8 beats, longest for 12 beats at 114 bpm. <1% isolated SVE, couplet/triplets. 3 episodes listed as VT, more likely to be SVT with aberrent conduction, fastest  at 190 bpm for 5 beats, longest for 19 beats at 113 bpm. <1% isolated VE, couplet/triplets. No atrial fibrillation/atrial flutter/high grade AV block, sinus pause >3sec noted. 1 patient triggered event, correlated with sinus rhythm.    EKG 12/13/2021: Sinus rhythm 95 bpm Possible old anteroseptal infarct  Echocardiogram 11/18/2021: 1. Left ventricular ejection fraction, by estimation, is 60 to 65%. The  left ventricle has normal function. The left ventricle has no regional  wall motion abnormalities.   2. Right ventricular systolic function is normal. The right ventricular  size is normal. There is normal pulmonary artery systolic pressure.   3. The mitral valve is myxomatous. Trivial mitral valve regurgitation. No  evidence of mitral stenosis. There is mild late systolic prolapse of both  leaflets of the mitral valve.   4. The aortic valve is normal in structure. Aortic valve regurgitation is  not visualized. No aortic stenosis is present.   5. The inferior vena cava is normal in size with greater than 50%  respiratory variability, suggesting right atrial pressure of 3 mmHg.    MRI brain 07/30/2021: 1. Mild white matter disease, likely ischemic and due to microvascular disease. This has  progressed very slightly since the prior MRI.  2. Mild mucosal thickening ethmoid air cells bilaterally.  Recent labs: 08/17/2022: Glucose 90, BUN/Cr 9/0.78. EGFR 88. Na/K 140/4.4. Rest of the CMP normal H/H 15/45. MCV 91. Platelets 264 HbA1C NA HS trop 10  11/18/2021: Glucose 111, BUN/Cr 10/0.93. EGFR >60. Na/K 134/3.7.  H/H 13/38. MCV 90. Platelets 278 HbA1C NA Chol 141, TG 61, HDL 42, LDL 87 TSH NA    Review of Systems  Cardiovascular:  Negative for chest pain, dyspnea on exertion, leg swelling, palpitations and syncope.         Vitals:   08/25/22 1323  BP: 135/82  Pulse: 67  Resp: 16  Temp: 98.6 F (37 C)  SpO2: 96%     Body mass index is 24.62 kg/m. Filed Weights    08/25/22 1323  Weight: 171 lb 9.6 oz (77.8 kg)     Objective:   Physical Exam Vitals and nursing note reviewed.  Constitutional:      General: He is not in acute distress. Neck:     Vascular: No JVD.  Cardiovascular:     Rate and Rhythm: Normal rate and regular rhythm.     Heart sounds: Normal heart sounds. No murmur heard. Pulmonary:     Effort: Pulmonary effort is normal.     Breath sounds: Normal breath sounds. No wheezing or rales.  Musculoskeletal:     Right lower leg: No edema.     Left lower leg: No edema.           Assessment & Recommendations:   81 year old Caucasian male with CAD, remote h/o PCI, hypothyroidism, prostate cancer, brief A. fib episode during COVID hospitalization in 10/2021  CAD: Remote  history. One angina episode. Recommend exercise nuclear stress test. Continue Crestor 10 mg daily. Continue plavix. Prescribed SL NTG.   Hypertension: Normal today. Recommend home monitoring. Will discuss the log at next visit.   Afib: While CHA2DS2VSc score is 5, he has not had any other known A-fib episodes, other than brief episode <24 hrs during [facility] hospitalization in 10/2021. No A-fib seen on to be cardiac telemetry.  I have encouraged him to consider wearing a smart watch or home monitoring.   Okay to hold off anticoagulation, unless recurrent Afib.  H/o TIA: Remote history. MRI brain 07/2021 showed no stroke, shows chronic microvascular changes. I do not think he needs DAPT. Continue plavix.   F/u after stress test    Nigel Mormon, MD Pager: [phone] Office: [phone]

## 2022-09-04 ENCOUNTER — Ambulatory Visit: Payer: Medicare Other

## 2022-09-04 DIAGNOSIS — I25118 Atherosclerotic heart disease of native coronary artery with other forms of angina pectoris: Secondary | ICD-10-CM

## 2022-09-11 NOTE — Progress Notes (Signed)
Called patient to inform him about his stress test

## 2022-10-02 ENCOUNTER — Ambulatory Visit: Payer: Medicare Other | Admitting: Cardiology

## 2022-10-02 ENCOUNTER — Ambulatory Visit (INDEPENDENT_AMBULATORY_CARE_PROVIDER_SITE_OTHER): Payer: Medicare Other | Admitting: Nurse Practitioner

## 2022-10-02 ENCOUNTER — Encounter: Payer: Self-pay | Admitting: Cardiology

## 2022-10-02 ENCOUNTER — Encounter: Payer: Self-pay | Admitting: Nurse Practitioner

## 2022-10-02 VITALS — BP 110/60 | HR 83 | Ht 71.0 in | Wt 167.5 lb

## 2022-10-02 VITALS — BP 133/69 | HR 60 | Resp 16 | Ht 71.0 in | Wt 166.0 lb

## 2022-10-02 DIAGNOSIS — I25118 Atherosclerotic heart disease of native coronary artery with other forms of angina pectoris: Secondary | ICD-10-CM

## 2022-10-02 DIAGNOSIS — K5909 Other constipation: Secondary | ICD-10-CM

## 2022-10-02 DIAGNOSIS — I2510 Atherosclerotic heart disease of native coronary artery without angina pectoris: Secondary | ICD-10-CM

## 2022-10-02 DIAGNOSIS — R1031 Right lower quadrant pain: Secondary | ICD-10-CM

## 2022-10-02 MED ORDER — DICYCLOMINE HCL 10 MG PO CAPS: 10.0000 mg | ORAL_CAPSULE | Freq: Every morning | ORAL | 0 refills | Status: DC

## 2022-10-02 NOTE — Patient Instructions (Addendum)
Apply Salonpas patch to Right Lower abdomen area where pain in located. Follow instructions on package.   Start Fiber Con  as directed.   Increase your water intake to 60 ounces per day.    We have sent the following medications to your pharmacy for you to pick up at your convenience: Dicyclomine   Call office in 3 weeks to let us know how your doing, ask for Mickel Baas -RN   _______________________________________________________  If you are age 83 or older, your body mass index should be between 23-30. Your Body mass index is 23.36 kg/m. If this is out of the aforementioned range listed, please consider follow up with your Primary Care Provider.  If you are age 30 or younger, your body mass index should be between 19-25. Your Body mass index is 23.36 kg/m. If this is out of the aformentioned range listed, please consider follow up with your Primary Care Provider.   ________________________________________________________  The [facility] providers would like to encourage you to use [facility] to communicate with providers for non-urgent requests or questions.  Due to long hold times on the telephone, sending your provider a message by [facility] may be a faster and more efficient way to get a response.  Please allow 48 business hours for a response.  Please remember that this is for non-urgent requests.  _______________________________________________________ Thank you for choosing me and [facility].  Tye Savoy NP

## 2022-10-02 NOTE — Progress Notes (Signed)
Chief Complaint:  Follow up constipation and RLQ pain.    Assessment &  Plan   # 81 yo male with chronic constipation characterized by hard,small pieces of stool which are difficult to pass. He has tried multiple medications of over the years. Continue daily MiraLAX.  Continue to Dulcolax Gummies a day Add daily FiberCon as directed Increase water intake to 60 ounces a day I never did receive his colonoscopy report.  I am making another request to [facility] at [phone].  Extension [phone] (nurse Caren Griffins) Asked Mr. Lauricella to call our office in about 3 weeks with a condition update.     # Chronic RLQ pain. Pain is not relieved with defecation. It is exacerbated by movements such as walking. It improves with rest but previously he reported that dicyclomine helped as well.  I think there is a musculoskeletal component to his pain.  CT scan for abdominal pain in March 2023 was unrevealing.  He is very concerned about the cause of this pain.  I have asked him to retry the dicyclomine 10 mg but only once daily.  Will call in a new prescription Trial of Salonpas patches to affected area as directed   HPI   81 yo male with pmh of hypothyroidism, HTN, HLD, ? TIA ( on plavix), prostate cancer.   See PMH /PSH for additional history   Mr. Schellenberg has a history chronic constipation for which he has tried multiple medications ( MiraLAX, Linzess, Amitiza, Dulcolax, prune juice) with variable effects.    I saw Mr. Winterton 04/22/2022 , his abdominal pain had improved with dicyclomine.  He had variable results with Amitiza taking it 1-2 times a day.  He was concerned that a hernia was causing intermittent bowel obstruction leading to fecal impactions.  Prior CT scan two months earlier in March 2023 was unrevealing. I thought he may have a component of pelvic floor dysfunction . I recommend he continue Amitiza, try glycerin suppositories as needed.    His last visit here was with Dr. Candis Schatz on  05/20/2022, please refer to that note for details.  In summary, he was having hard stool and significant straining. He had stopped the Amitiza because it gave him diarrhea.  Dr. Candis Schatz recommended TID Miralax, Senna if no BM in 2 days,  a high fiber diet, kiwi and increased water intake.    Interval History :  Mr Quezada is mainly concerned about persistent RLQ pain. Sometimes the pain radiates across low abdomen. Pain is nearly constant. Walking and activity exacerbates the pain, as does constipation. However the pain is not relieved with defecation.  Resting tends to help the pain .  No dysuria . He is still struggling with constipation.  He is taking 2 Dulcolax gummies a day which help but still has stools that are hard, small pieces. No blood in stool. He is taking the the miralax only once a day. He says he has tried it more than once a day and it doesn't help.  Tried Kiwi but it didn't help.  He admitted to not drinking enough water. He doesn't take a fiber supplement. He says he has a good dinner everyday but doesn't eat much breakfast or lunch. His weight is stable.   Mr Thayer Jew is most concerned about the RLQ pain. It feels there is "something wrong in there"   He is scheduled to see Neurology soon for evaluation of tremors.    Labs:     Latest Ref Rng &  Units 11/19/2021    3:28 AM 11/18/2021    4:22 AM 11/17/2021    4:03 PM  CBC  WBC 4.0 - 10.5 K/uL 7.4  8.9  12.0   Hemoglobin 13.0 - 17.0 g/dL 13.2  11.7  12.6   Hematocrit 39.0 - 52.0 % 38.9  34.2  38.6   Platelets 150 - 400 K/uL 278  300  356        Latest Ref Rng & Units 11/17/2021    4:03 PM  Hepatic Function  Total Protein 6.5 - 8.1 g/dL 7.1   Albumin 3.5 - 5.0 g/dL 3.5   AST 15 - 41 U/L 24   ALT 0 - 44 U/L 25   Alk Phosphatase 38 - 126 U/L 141   Total Bilirubin 0.3 - 1.2 mg/dL 0.9      Past Medical History:  Diagnosis Date   Cancer ([place])    prostate, 2002   H/O heart artery stent    Hypothyroidism     Past  Surgical History:  Procedure Laterality Date   BACK SURGERY     CARDIAC CATHETERIZATION     CATARACT EXTRACTION Bilateral    PROSTATE SURGERY     REVERSE SHOULDER ARTHROPLASTY Right 11/06/2021   Procedure: REVERSE SHOULDER ARTHROPLASTY;  Surgeon: Hiram Gash, MD;  Location: [facility];  Service: Orthopedics;  Laterality: Right;   TONSILLECTOMY      Current Medications, Allergies, Family History and Social History were reviewed in [employer] record.     Current Outpatient Medications  Medication Sig Dispense Refill   ALPRAZolam (XANAX) 1 MG tablet Take 1 mg by mouth daily as needed.     amLODipine (NORVASC) 2.5 MG tablet Take 2.5 mg by mouth daily.     atorvastatin (LIPITOR) 10 MG tablet Take 10 mg by mouth daily.     buPROPion (WELLBUTRIN XL) 300 MG 24 hr tablet Take 300 mg by mouth daily.     Cholecalciferol (DIALYVITE VITAMIN D 5000) 125 MCG (5000 UT) capsule Take 5,000 Units by mouth daily.     clopidogrel (PLAVIX) 75 MG tablet Take 75 mg by mouth daily.     esomeprazole (NEXIUM) 20 MG capsule Take 20 mg by mouth daily at 12 noon.     levothyroxine (SYNTHROID) 75 MCG tablet Take 75 mcg by mouth daily before breakfast.     mirtazapine (REMERON) 45 MG tablet Take 45 mg by mouth at bedtime.     nitroGLYCERIN (NITROSTAT) 0.4 MG SL tablet Place 1 tablet (0.4 mg total) under the tongue every 5 (five) minutes as needed for chest pain. 30 tablet 3   pravastatin (PRAVACHOL) 10 MG tablet Take 10 mg by mouth daily.     thyroid (ARMOUR) 60 MG tablet Take 60 mg by mouth daily before breakfast.     No current facility-administered medications for this visit.    Review of Systems: No chest pain. No shortness of breath. No urinary complaints.    Physical Exam  Wt Readings from Last 3 Encounters:  10/02/22 167 lb 8 oz (76 kg)  08/25/22 171 lb 9.6 oz (77.8 kg)  05/20/22 167 lb (75.8 kg)    BP 110/60   Pulse 83   Ht _0  (1.803 m)   Wt 167 lb 8 oz (76 kg)   BMI  23.36 kg/m  Constitutional:  Generally well appearing male in no acute distress. Psychiatric: Pleasant. Normal mood and affect. Behavior is normal. EENT: Pupils normal.  Conjunctivae are normal. No  scleral icterus. Neck supple.  Cardiovascular: Normal rate, regular rhythm, + murmur. No edema Pulmonary/chest: Effort normal and breath sounds normal. No wheezing, rales or rhonchi. Abdominal: Soft, nondistended. Initially there was moderate LLQ and RLQ tenderness but this was much less pronounced with repeat abdominal exam. Bowel sounds active throughout. There are no masses palpable. No hepatomegaly. Negative psoas sign and negative obturater sign Neurological: Alert and oriented to person place and time. Skin: Skin is warm and dry. No rashes noted.  I spent 30 minutes total reviewing records, obtaining history, performing exam, counseling patient and documenting visit / findings.    Tye Savoy, NP  10/02/2022, 8:59 AM

## 2022-10-02 NOTE — Progress Notes (Signed)
Agree with the assessment and plan as outlined by Paula Guenther, NP.  Victoriano Campion E. Wade Asebedo, MD [facility]  

## 2022-10-02 NOTE — Progress Notes (Signed)
Patient referred by [name], MD for atrial fibrillation  Subjective:   Robert Horne, male    DOB: 06/05/1941, 81 y.o.   MRN: [id number]   Chief Complaint  Patient presents with   Coronary Artery Disease   Follow-up   Suture / Staple Removal     HPI   81 year old Caucasian male with CAD, remote h/o PCI, hypothyroidism, prostate cancer, brief A. fib episode during [place] hospitalization in 10/2021  Reviewed recent test results with the patient, details below. Patient has not had any recurrent episode of chest pain.  Office note 11/2021: Patient had brief Afib episode for <24 hrs, self converted to sinus rhythm. I had not seen the patient during this hospitalization, but had provided following recommendations pending outpatient follow up. EF was normal on echocardiogram. Although CHA2DS2VASc score is high at 5, I felt it was okay to omit anticoagulation given very short duration in the setting of COVID, and continue DAPT as his baseline TIA management. Will need to investigate what duration if DAPT is required from h/o TIA standpoint. After COVID isolation is completed, will arrange outpatient office visit and monitor.    Patient is here for hospital follow-up visit today.  He and his wife moved from [place] to [place] to be at [employer].  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  He has known CAD with LAD stent in 2003, with subsequent caths showing nonobstructive disease, most recently in 2014.  Presently, he stays active with walking, taking care of his wife as primary caregiver.  He denies any complaints of chest pain, shortness of breath.  He also denies any palpitations and did not feel any symptoms during his short lasting episode of A. fib during COVID hospitalization in 10/2021.    Patient had an episode of TIA in 2017, where he had difficulty reading newspaper worse.  He has been on aspirin and Plavix  since then.  Most recently, he underwent MRI brain in 07/2021, that showed chronic microvascular changes, but no evidence of prior stroke.  Patient has not had lipid panel in over a year.  Previously, he did not tolerate Crestor due to myalgias, and is therefore on pravastatin.   Current Outpatient Medications:    ALPRAZolam (XANAX) 1 MG tablet, Take 1 mg by mouth daily as needed., Disp: , Rfl:    amLODipine (NORVASC) 2.5 MG tablet, Take 2.5 mg by mouth daily., Disp: , Rfl:    atorvastatin (LIPITOR) 10 MG tablet, Take 10 mg by mouth daily., Disp: , Rfl:    buPROPion (WELLBUTRIN XL) 300 MG 24 hr tablet, Take 300 mg by mouth daily., Disp: , Rfl:    Cholecalciferol (DIALYVITE VITAMIN D 5000) 125 MCG (5000 UT) capsule, Take 5,000 Units by mouth daily., Disp: , Rfl:    clopidogrel (PLAVIX) 75 MG tablet, Take 75 mg by mouth daily., Disp: , Rfl:    dicyclomine (BENTYL) 10 MG capsule, Take 1 capsule (10 mg total) by mouth in the morning., Disp: 30 capsule, Rfl: 0   esomeprazole (NEXIUM) 20 MG capsule, Take 20 mg by mouth daily at 12 noon., Disp: , Rfl:    levothyroxine (SYNTHROID) 75 MCG tablet, Take 75 mcg by mouth daily before breakfast., Disp: , Rfl:    mirtazapine (REMERON) 45 MG tablet, Take 45 mg by mouth at bedtime., Disp: , Rfl:    nitroGLYCERIN (NITROSTAT) 0.4 MG SL tablet, Place 1 tablet (0.4 mg total) under the tongue  every 5 (five) minutes as needed for chest pain., Disp: 30 tablet, Rfl: 3   pravastatin (PRAVACHOL) 10 MG tablet, Take 10 mg by mouth daily., Disp: , Rfl:    thyroid (ARMOUR) 60 MG tablet, Take 60 mg by mouth daily before breakfast., Disp: , Rfl:   Cardiovascular and other pertinent studies:  EKG 10/02/2022: Sinus rhythm 69 bpm Normal EKG  Modified Bruce exercise Tetrofosmin stress test 09/04/2022: Exercise nuclear stress test was performed using modified Bruce protocol. Patient reached 3.7 METS, and 85% of age predicted maximum heart rate. Exercise capacity was low. No chest  pain reported. Heart rate and hemodynamic response were normal. Stress EKG revealed no ischemic changes. Normal myocardial perfusion. Stress LVEF 65%. Low risk study.    EKG 08/25/2022: Sinus rhythm 64 bpm Occasional PAC   Nonspecific T-abnormality  Mobile cardiac telemetry 13 days 12/13/2021 - 12/27/2021: Dominant rhythm: Sinus. HR 40-130 bpm. Avg HR 69 bpm, in sinus rhythm with first degree AV block. 12 episodes of SVT, fastest at 154 bpm for 8 beats, longest for 12 beats at 114 bpm. <1% isolated SVE, couplet/triplets. 3 episodes listed as VT, more likely to be SVT with aberrent conduction, fastest at 190 bpm for 5 beats, longest for 19 beats at 113 bpm. <1% isolated VE, couplet/triplets. No atrial fibrillation/atrial flutter/high grade AV block, sinus pause >3sec noted. 1 patient triggered event, correlated with sinus rhythm.   Echocardiogram 11/18/2021: 1. Left ventricular ejection fraction, by estimation, is 60 to 65%. The  left ventricle has normal function. The left ventricle has no regional  wall motion abnormalities.   2. Right ventricular systolic function is normal. The right ventricular  size is normal. There is normal pulmonary artery systolic pressure.   3. The mitral valve is myxomatous. Trivial mitral valve regurgitation. No  evidence of mitral stenosis. There is mild late systolic prolapse of both  leaflets of the mitral valve.   4. The aortic valve is normal in structure. Aortic valve regurgitation is  not visualized. No aortic stenosis is present.   5. The inferior vena cava is normal in size with greater than 50%  respiratory variability, suggesting right atrial pressure of 3 mmHg.    MRI brain 07/30/2021: 1. Mild white matter disease, likely ischemic and due to microvascular disease. This has  progressed very slightly since the prior MRI.  2. Mild mucosal thickening ethmoid air cells bilaterally.  Recent labs: 08/17/2022: Glucose 90, BUN/Cr 9/0.78. EGFR 88. Na/K  140/4.4. Rest of the CMP normal H/H 15/45. MCV 91. Platelets 264 HbA1C NA HS trop 10  11/18/2021: Glucose 111, BUN/Cr 10/0.93. EGFR >60. Na/K 134/3.7.  H/H 13/38. MCV 90. Platelets 278 HbA1C NA Chol 141, TG 61, HDL 42, LDL 87 TSH NA    Review of Systems  Cardiovascular:  Negative for chest pain, dyspnea on exertion, leg swelling, palpitations and syncope.         Vitals:   10/02/22 1413  BP: 133/69  Pulse: 60  Resp: 16  SpO2: 95%     Body mass index is 23.15 kg/m. Filed Weights   10/02/22 1413  Weight: 166 lb (75.3 kg)     Objective:   Physical Exam Vitals and nursing note reviewed.  Constitutional:      General: He is not in acute distress. Neck:     Vascular: No JVD.  Cardiovascular:     Rate and Rhythm: Normal rate and regular rhythm.     Heart sounds: Normal heart sounds. No murmur heard. Pulmonary:  Effort: Pulmonary effort is normal.     Breath sounds: Normal breath sounds. No wheezing or rales.  Musculoskeletal:     Right lower leg: No edema.     Left lower leg: No edema.           Assessment & Recommendations:   81 year old Caucasian male with CAD, remote h/o PCI, hypothyroidism, prostate cancer, brief A. fib episode during [facility] hospitalization in 10/2021  CAD: Remote history. One angina episode. Exercise nuclear stress test with 3.7 METS, no ischemia (08/2022) Continue Crestor 10 mg daily. Continue plavix. (CAD, h/o TIA, tolerating monotherapy with plavix without bleeding issues. Reasonable to continue). Prescribed SL NTG.   Hypertension: Well controlled.  Afib: While CHA2DS2VSc score is 5, he has not had any other known A-fib episodes, other than brief episode <24 hrs during [place] hospitalization in 10/2021. No A-fib seen on to be cardiac telemetry.  I have encouraged him to consider wearing a smart watch or home monitoring.   Okay to hold off anticoagulation, unless recurrent Afib.  H/o TIA: Remote history. MRI brain  07/2021 showed no stroke, shows chronic microvascular changes. I do not think he needs DAPT. Continue plavix.   F/u 6 months    Nigel Mormon, MD Pager: [phone] Office: [phone]

## 2022-10-07 ENCOUNTER — Encounter: Payer: Self-pay | Admitting: Internal Medicine

## 2022-10-07 ENCOUNTER — Ambulatory Visit (INDEPENDENT_AMBULATORY_CARE_PROVIDER_SITE_OTHER): Payer: Medicare Other | Admitting: Internal Medicine

## 2022-10-07 ENCOUNTER — Other Ambulatory Visit: Payer: Self-pay | Admitting: Internal Medicine

## 2022-10-07 VITALS — BP 128/66 | HR 66 | Temp 97.4°F

## 2022-10-07 DIAGNOSIS — S61412A Laceration without foreign body of left hand, initial encounter: Secondary | ICD-10-CM

## 2022-10-07 DIAGNOSIS — M25512 Pain in left shoulder: Secondary | ICD-10-CM | POA: Diagnosis not present

## 2022-10-07 DIAGNOSIS — R5383 Other fatigue: Secondary | ICD-10-CM | POA: Insufficient documentation

## 2022-10-07 DIAGNOSIS — R531 Weakness: Secondary | ICD-10-CM

## 2022-10-07 DIAGNOSIS — W19XXXA Unspecified fall, initial encounter: Secondary | ICD-10-CM | POA: Diagnosis not present

## 2022-10-07 DIAGNOSIS — E559 Vitamin D deficiency, unspecified: Secondary | ICD-10-CM

## 2022-10-07 MED ORDER — TETANUS-DIPHTHERIA TOXOIDS TD 2-2 LF/0.5ML IM SUSP: 0.5000 mL | Freq: Once | INTRAMUSCULAR | 0 refills | Status: AC

## 2022-10-07 MED ORDER — CELECOXIB 200 MG PO CAPS: 200.0000 mg | ORAL_CAPSULE | Freq: Every day | ORAL | 2 refills | Status: DC

## 2022-10-07 NOTE — Assessment & Plan Note (Signed)
Wound was cleaned with water and steristrip was applied and bandage was done

## 2022-10-07 NOTE — Progress Notes (Signed)
Established Patient Office Visit  Subjective   Patient ID: Robert Horne, male    DOB: 05/17/1941  Age: 81 y.o. MRN: [id number]  Chief Complaint  Patient presents with   Fall  81 years old male who fell when his dog leash stuck in his legs and fell on his left side.  He is complaining of pain in his left elbow and left shoulder.  He also has laceration on his left hand.  This happened in [place] where he went to visit his daughter.  He came back to independent living.  He came to our clinic for labs drawn and was seen by me because of his pain and wound on his left hand.  There is no loss of consciousness.  He was also seen by me last week with depression and insomnia.  I started him on Seroquel 25 mg at bedtime that help him to sleep and also help him with his depression.  He says that he is able to sleep better now.  I have also referred him for physical therapy that has not been started yet.      Review of Systems  Constitutional: Negative.   Respiratory: Negative.    Cardiovascular: Negative.   Gastrointestinal: Negative.   Musculoskeletal:  Positive for joint pain.  Neurological:  Positive for tremors.      Objective:     BP 128/66   Pulse 66   Temp (!) 97.4 F (36.3 C)   SpO2 98%    Physical Exam Constitutional:      Appearance: Normal appearance.  HENT:     Head: Normocephalic and atraumatic.  Eyes:     Extraocular Movements: Extraocular movements intact.     Pupils: Pupils are equal, round, and reactive to light.  Cardiovascular:     Rate and Rhythm: Normal rate and regular rhythm.  Pulmonary:     Effort: Pulmonary effort is normal.     Breath sounds: Normal breath sounds and air entry.  Abdominal:     Palpations: Abdomen is soft.  Musculoskeletal:        General: Deformity and signs of injury present.     Left shoulder: Tenderness present. Decreased range of motion.     Left elbow: Decreased range of motion. Tenderness present.     Cervical back: Normal  range of motion.  Skin:    General: Skin is warm.     Findings: Laceration present.       Neurological:     Mental Status: He is alert.      No results found for any visits on 10/07/22.    The ASCVD Risk score (Arnett DK, et al., 2019) failed to calculate for the following reasons:   The 2019 ASCVD risk score is only valid for ages 65 to 89    Assessment & Plan:   Problem List Items Addressed This Visit       Other   Fall - Primary   Laceration of left hand without foreign body    Wound was cleaned with water and steristrip was applied and bandage was done      Acute pain of left shoulder    Worried about rotator cuff injury, will start on celebrex 200 mg daily for 4 weeks then will re-evaluate.      Weakness generalized  Depression and insomnia. He sazys he is very  depress but no suicidal thoughts, he is crying in our office last weak and was asking something that can help him  to sleep.  Return if symptoms worsen or fail to improve.    Garwin Brothers, MD

## 2022-10-07 NOTE — Assessment & Plan Note (Addendum)
Worried about rotator cuff injury, will start on celebrex 200 mg daily for 4 weeks then will re-evaluate.

## 2022-10-08 LAB — COMPREHENSIVE METABOLIC PANEL
ALT: 23 IU/L (ref 0–44)
AST: 27 IU/L (ref 0–40)
Albumin/Globulin Ratio: 1.5 (ref 1.2–2.2)
Albumin: 4.7 g/dL (ref 3.7–4.7)
Alkaline Phosphatase: 155 IU/L — ABNORMAL HIGH (ref 44–121)
BUN/Creatinine Ratio: 13 (ref 10–24)
BUN: 13 mg/dL (ref 8–27)
Bilirubin Total: 0.6 mg/dL (ref 0.0–1.2)
CO2: 23 mmol/L (ref 20–29)
Calcium: 9.8 mg/dL (ref 8.6–10.2)
Chloride: 100 mmol/L (ref 96–106)
Creatinine, Ser: 1.03 mg/dL (ref 0.76–1.27)
Globulin, Total: 3.1 g/dL (ref 1.5–4.5)
Glucose: 108 mg/dL — ABNORMAL HIGH (ref 70–99)
Potassium: 4.9 mmol/L (ref 3.5–5.2)
Sodium: 140 mmol/L (ref 134–144)
Total Protein: 7.8 g/dL (ref 6.0–8.5)
eGFR: 72 mL/min/{1.73_m2} (ref >59)

## 2022-10-08 LAB — TSH: TSH: 6.07 u[IU]/mL — ABNORMAL HIGH (ref 0.450–4.500)

## 2022-10-08 LAB — CBC
Hematocrit: 47.1 % (ref 37.5–51.0)
Hemoglobin: 15.9 g/dL (ref 13.0–17.7)
MCH: 30.4 pg (ref 26.6–33.0)
MCHC: 33.8 g/dL (ref 31.5–35.7)
MCV: 90 fL (ref 79–97)
Platelets: 310 10*3/uL (ref 150–450)
RBC: 5.23 x10E6/uL (ref 4.14–5.80)
RDW: 12.6 % (ref 11.6–15.4)
WBC: 9.5 10*3/uL (ref 3.4–10.8)

## 2022-10-08 LAB — SPECIMEN STATUS REPORT

## 2022-10-08 LAB — VITAMIN D 25 HYDROXY (VIT D DEFICIENCY, FRACTURES): Vit D, 25-Hydroxy: 35.7 ng/mL (ref 30.0–100.0)

## 2022-10-08 MED ORDER — QUETIAPINE FUMARATE 25 MG PO TABS: 25.0000 mg | ORAL_TABLET | Freq: Every day | ORAL | 1 refills | Status: DC

## 2022-10-08 NOTE — Addendum Note (Signed)
Addended byGarwin Brothers on: 10/08/2022 02:34 PM   Modules accepted: Orders

## 2022-10-19 ENCOUNTER — Other Ambulatory Visit: Payer: Self-pay | Admitting: Internal Medicine

## 2022-10-19 MED ORDER — LEVOTHYROXINE SODIUM 75 MCG PO TABS: 75.0000 ug | ORAL_TABLET | Freq: Every day | ORAL | 6 refills | Status: DC

## 2022-10-23 ENCOUNTER — Ambulatory Visit: Payer: Medicare Other | Admitting: Nurse Practitioner

## 2022-11-11 ENCOUNTER — Other Ambulatory Visit: Payer: Self-pay | Admitting: Nurse Practitioner

## 2022-12-02 ENCOUNTER — Ambulatory Visit: Payer: Medicare Other | Admitting: Neurology

## 2022-12-16 ENCOUNTER — Other Ambulatory Visit: Payer: Self-pay

## 2022-12-16 MED ORDER — QUETIAPINE FUMARATE 25 MG PO TABS: 25.0000 mg | ORAL_TABLET | Freq: Every day | ORAL | 1 refills | Status: DC

## 2023-01-08 ENCOUNTER — Other Ambulatory Visit: Payer: Self-pay | Admitting: Nurse Practitioner

## 2023-01-19 ENCOUNTER — Ambulatory Visit: Payer: Medicare Other | Admitting: Neurology

## 2023-01-19 ENCOUNTER — Encounter: Payer: Self-pay | Admitting: Neurology

## 2023-02-03 ENCOUNTER — Ambulatory Visit: Payer: Medicare Other | Admitting: Internal Medicine

## 2023-02-03 ENCOUNTER — Encounter: Payer: Self-pay | Admitting: Internal Medicine

## 2023-02-03 VITALS — BP 124/72 | HR 87 | Temp 97.8°F | Resp 18 | Ht 71.0 in | Wt 164.2 lb

## 2023-02-03 DIAGNOSIS — I25118 Atherosclerotic heart disease of native coronary artery with other forms of angina pectoris: Secondary | ICD-10-CM

## 2023-02-03 DIAGNOSIS — R5383 Other fatigue: Secondary | ICD-10-CM | POA: Diagnosis not present

## 2023-02-03 DIAGNOSIS — E039 Hypothyroidism, unspecified: Secondary | ICD-10-CM

## 2023-02-03 DIAGNOSIS — F32A Depression, unspecified: Secondary | ICD-10-CM | POA: Insufficient documentation

## 2023-02-03 DIAGNOSIS — F3341 Major depressive disorder, recurrent, in partial remission: Secondary | ICD-10-CM

## 2023-02-03 DIAGNOSIS — E782 Mixed hyperlipidemia: Secondary | ICD-10-CM | POA: Diagnosis not present

## 2023-02-03 NOTE — Progress Notes (Signed)
   Office Visit  Subjective   Patient ID: Robert Horne   DOB: July 21, 1941   Age: 82 y.o.   MRN: [id number]   Chief Complaint Chief Complaint  Patient presents with   office visit    Need check up and PSA drawn     History of Present Illness 82 years old male is here for follow up. He says he is sleeping better but he stay tired all the time, he started going to mail box but just going to mail box and coming back make him very tired. No SOB, NO chest pain. He was diagnosed with Parkinsonism and by taking tablet three time a day his tremors are gone.  He has depression and take Wellbutrin 300 mg and Remeron 45 mg at night. I have added seroquil 25 mg that help him sleep. He also take xanax once a month.  He has CAD and follows with cardiologist.  He has hypothyroidism and take Armour 60 and synthroid 75 mg and he is due for TSH today.   Past Medical History Past Medical History:  Diagnosis Date   Cancer [facility])    prostate, 2002   H/O heart artery stent    Hypothyroidism      Allergies No Known Allergies   Review of Systems Review of Systems  Constitutional:  Positive for malaise/fatigue.  HENT: Negative.    Respiratory: Negative.    Cardiovascular: Negative.   Gastrointestinal: Negative.   Neurological: Negative.        Objective:    Vitals BP 124/72 (BP Location: Left Arm, Patient Position: Sitting, Cuff Size: Normal)   Pulse 87   Temp 97.8 F (36.6 C)   Resp 18   Ht 5\' 11"  (1.803 m)   Wt 164 lb 4 oz (74.5 kg)   SpO2 97%   BMI 22.91 kg/m    Physical Examination Physical Exam Constitutional:      Appearance: Normal appearance. He is normal weight.  HENT:     Head: Normocephalic and atraumatic.  Eyes:     Extraocular Movements: Extraocular movements intact.     Pupils: Pupils are equal, round, and reactive to light.  Cardiovascular:     Rate and Rhythm: Normal rate and regular rhythm.     Heart sounds: Normal heart sounds.  Pulmonary:     Effort:  Pulmonary effort is normal.     Breath sounds: Normal breath sounds.  Abdominal:     General: Bowel sounds are normal.     Palpations: Abdomen is soft.  Neurological:     General: No focal deficit present.     Mental Status: He is alert and oriented to person, place, and time.        Assessment & Plan:   Hypothyroidism He take armour 60 and synthroid 75 mcg daily. Will do TSH today.  Hyperlipidemia He is not taking any cholesterol medicine so will do lipid panel today.  Depression He take wellbutrin 300 mg daily, remeron 45 mg and seroquil 25 mg at night and is still depress.   Coronary artery disease of native artery of native heart with stable angina pectoris ([name]) Stable but do not take cholesterol medicine so will do lipid panel    Return in about 2 weeks (around 02/17/2023).   Garwin Brothers, MD

## 2023-02-03 NOTE — Assessment & Plan Note (Signed)
He take wellbutrin 300 mg daily, remeron 45 mg and seroquil 25 mg at night and is still depress.

## 2023-02-03 NOTE — Assessment & Plan Note (Signed)
Stable but do not take cholesterol medicine so will do lipid panel

## 2023-02-03 NOTE — Assessment & Plan Note (Signed)
He take armour 60 and synthroid 75 mcg daily. Will do TSH today.

## 2023-02-03 NOTE — Assessment & Plan Note (Signed)
He is not taking any cholesterol medicine so will do lipid panel today.

## 2023-02-04 ENCOUNTER — Telehealth: Payer: Self-pay | Admitting: Internal Medicine

## 2023-02-04 LAB — CMP14 + ANION GAP
ALT: 9 IU/L (ref 0–44)
AST: 21 IU/L (ref 0–40)
Albumin/Globulin Ratio: 1.3 (ref 1.2–2.2)
Albumin: 3.8 g/dL (ref 3.7–4.7)
Alkaline Phosphatase: 159 IU/L — ABNORMAL HIGH (ref 44–121)
Anion Gap: 12 mmol/L (ref 10.0–18.0)
BUN/Creatinine Ratio: 16 (ref 10–24)
BUN: 13 mg/dL (ref 8–27)
Bilirubin Total: 0.3 mg/dL (ref 0.0–1.2)
CO2: 23 mmol/L (ref 20–29)
Calcium: 8.9 mg/dL (ref 8.6–10.2)
Chloride: 103 mmol/L (ref 96–106)
Creatinine, Ser: 0.8 mg/dL (ref 0.76–1.27)
Globulin, Total: 2.9 g/dL (ref 1.5–4.5)
Glucose: 119 mg/dL — ABNORMAL HIGH (ref 70–99)
Potassium: 4.2 mmol/L (ref 3.5–5.2)
Sodium: 138 mmol/L (ref 134–144)
Total Protein: 6.7 g/dL (ref 6.0–8.5)
eGFR: 88 mL/min/{1.73_m2} (ref >59)

## 2023-02-04 LAB — LIPID PANEL
Chol/HDL Ratio: 3.2 ratio (ref 0.0–5.0)
Cholesterol, Total: 187 mg/dL (ref 100–199)
HDL: 58 mg/dL (ref >39)
LDL Chol Calc (NIH): 110 mg/dL — ABNORMAL HIGH (ref 0–99)
Triglycerides: 109 mg/dL (ref 0–149)
VLDL Cholesterol Cal: 19 mg/dL (ref 5–40)

## 2023-02-04 LAB — TSH: TSH: 4.32 u[IU]/mL (ref 0.450–4.500)

## 2023-02-04 LAB — SEDIMENTATION RATE: Sed Rate: 26 mm/hr (ref 0–30)

## 2023-02-04 NOTE — Telephone Encounter (Signed)
I have spoken with patient about his test result. Will continue with current treatment.

## 2023-02-17 ENCOUNTER — Ambulatory Visit: Payer: Medicare Other | Admitting: Internal Medicine

## 2023-02-17 ENCOUNTER — Encounter: Payer: Self-pay | Admitting: Internal Medicine

## 2023-02-17 VITALS — BP 128/78 | HR 82 | Temp 97.9°F | Resp 18 | Ht 71.0 in | Wt 163.1 lb

## 2023-02-17 DIAGNOSIS — E039 Hypothyroidism, unspecified: Secondary | ICD-10-CM | POA: Diagnosis not present

## 2023-02-17 DIAGNOSIS — K5901 Slow transit constipation: Secondary | ICD-10-CM

## 2023-02-17 DIAGNOSIS — K5900 Constipation, unspecified: Secondary | ICD-10-CM | POA: Insufficient documentation

## 2023-02-17 MED ORDER — LINACLOTIDE 145 MCG PO CAPS: 145.0000 ug | ORAL_CAPSULE | Freq: Every day | ORAL | 3 refills | Status: DC

## 2023-02-17 NOTE — Assessment & Plan Note (Signed)
stable °

## 2023-02-17 NOTE — Progress Notes (Signed)
   Office Visit  Subjective   Patient ID: Robert Horne   DOB: September 15, 1941   Age: 82 y.o.   MRN: [id number]   Chief Complaint Chief Complaint  Patient presents with   Follow-up    2 week follow up blood work     History of Present Illness 82 years old male is here for follow up. He says his tiredness is better, he is sleeping ok. I have reviewed his labs including thyroid function. He is going to see neurologist next week to establish care here for Parkinsonism. He says he has constipation and admit that he does not drink enough fluid.  Past Medical History Past Medical History:  Diagnosis Date   Cancer [facility])    prostate, 2002   H/O heart artery stent    Hypothyroidism      Allergies No Known Allergies   Review of Systems Review of Systems  Constitutional: Negative.   HENT: Negative.    Respiratory: Negative.    Cardiovascular: Negative.   Gastrointestinal:  Positive for constipation.  Neurological: Negative.        Objective:    Vitals BP 128/78 (BP Location: Left Arm, Patient Position: Sitting, Cuff Size: Normal)   Pulse 82   Temp 97.9 F (36.6 C)   Resp 18   Ht 5\' 11"  (1.803 m)   Wt 163 lb 2 oz (74 kg)   SpO2 98%   BMI 22.75 kg/m    Physical Examination Physical Exam Constitutional:      Appearance: Normal appearance.  Cardiovascular:     Rate and Rhythm: Normal rate and regular rhythm.     Heart sounds: Normal heart sounds.  Pulmonary:     Effort: Pulmonary effort is normal.     Breath sounds: Normal breath sounds.  Abdominal:     General: Bowel sounds are normal.     Palpations: Abdomen is soft.  Neurological:     Mental Status: He is alert.        Assessment & Plan:   Constipation I will start him on Linzess and he will hold for loose stool. He also will drink more water.  Hypothyroidism stable    Return in about 3 months (around 05/20/2023).   Garwin Brothers, MD

## 2023-02-17 NOTE — Assessment & Plan Note (Signed)
I will start him on Linzess and he will hold for loose stool. He also will drink more water.

## 2023-02-24 ENCOUNTER — Encounter: Payer: Self-pay | Admitting: Neurology

## 2023-02-24 ENCOUNTER — Ambulatory Visit (INDEPENDENT_AMBULATORY_CARE_PROVIDER_SITE_OTHER): Payer: Medicare Other | Admitting: Neurology

## 2023-02-24 VITALS — BP 107/58 | HR 70 | Ht 70.5 in | Wt 168.6 lb

## 2023-02-24 DIAGNOSIS — G20C Parkinsonism, unspecified: Secondary | ICD-10-CM | POA: Diagnosis not present

## 2023-02-24 DIAGNOSIS — Z9181 History of falling: Secondary | ICD-10-CM

## 2023-02-24 DIAGNOSIS — K5909 Other constipation: Secondary | ICD-10-CM | POA: Diagnosis not present

## 2023-02-24 DIAGNOSIS — R413 Other amnesia: Secondary | ICD-10-CM | POA: Diagnosis not present

## 2023-02-24 MED ORDER — CARBIDOPA-LEVODOPA ER 25-100 MG PO TBCR: EXTENDED_RELEASE_TABLET | ORAL | 1 refills | Status: DC
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
## 2023-02-24 NOTE — Progress Notes (Signed)
Records request to Dr. Coralee North [facility]. ([facility]). Per [employer].

## 2023-02-24 NOTE — Patient Instructions (Signed)
You likely have Parkinson's disease with dome memory loss.  As you know, this disease does progress with time. It can affect your balance, your memory, your mood, your bowel and bladder function, your posture, balance and walking and your activities of daily living. However, there are good supportive treatments and symptomatic treatments available, so most patients have a change to a good quality life and life expectancy is not typically altered. Overall you are doing fairly well but I do want to suggest a few things today:  Remember to drink plenty of fluid at least 6 glasses (8 oz each), eat healthy meals and do not skip any meals. Try to eat protein with a every meal and eat a healthy snack such as fruit or nuts in between meals. Try to keep a regular sleep-wake schedule and try to exercise daily, particularly in the form of walking, 20-30 minutes a day, if you can.   Taking your medication on schedule is key. As far as your medications are concerned, I would like to suggest that you take your current medication with the following additional changes: we will change from levodopa (immediate release) to Sinemet CR, 25-100 mg strength, 1 pill three times a day, take at 7 AM, 12 and 5 PM daily.     As far as diagnostic testing, we may consider a brain MRI in the near future.   We will get records from Dr. Lorelle Gibbs regarding your cognitive evaluation.   I am worried about your driving, please have your family monitor it and I would suggest at this point only local roads, familiar routes, no nighttime and no highway driving.   You can get formal evaluation done for your driving, here are some places and numbers:   [facility] [employer] [facility] in [place]: [phone]  2.   [facility]: [phone]  3.   [facility]: [id number].   [facility]: [phone] or [phone]   I would like to see you back in 3-4 months, sooner if we need to. Please call us with any  interim questions, concerns, problems, updates or refill requests.  Our phone number is [phone]. We also have an after hours call service for urgent matters and there is a physician on-call for urgent questions, that cannot wait till the next work day. For any emergencies you know to call 911 or go to the nearest emergency room.   You can email me through my chart and also leave a phone message for my nurse.

## 2023-04-02 ENCOUNTER — Ambulatory Visit: Payer: Medicare Other | Admitting: Cardiology

## 2023-04-02 ENCOUNTER — Encounter: Payer: Self-pay | Admitting: Cardiology

## 2023-04-02 VITALS — BP 114/69 | HR 72 | Ht 70.5 in | Wt 170.0 lb

## 2023-04-02 DIAGNOSIS — E782 Mixed hyperlipidemia: Secondary | ICD-10-CM

## 2023-04-02 DIAGNOSIS — I25118 Atherosclerotic heart disease of native coronary artery with other forms of angina pectoris: Secondary | ICD-10-CM

## 2023-04-02 MED ORDER — ROSUVASTATIN CALCIUM 20 MG PO TABS: 20.0000 mg | ORAL_TABLET | Freq: Every day | ORAL | 3 refills | Status: DC

## 2023-04-02 NOTE — Progress Notes (Signed)
Patient referred by Flora Lipps, MD for atrial fibrillation  Subjective:   Robert Horne, male    DOB: Aug 26, 1941, 82 y.o.   MRN: [id number]   Chief Complaint  Patient presents with   Coronary artery disease of native artery of native heart wi   Follow-up     HPI   82 year old Caucasian male with CAD, remote h/o PCI, hypothyroidism, prostate cancer, brief A. fib episode during COVID hospitalization in 10/2021  Patient is doing well, denies chest pain, shortness of breath, palpitations, leg edema, orthopnea, PND, TIA/syncope.Reviewed recent test results with the patient, details below.   Office note 11/2021: Patient had brief Afib episode for <24 hrs, self converted to sinus rhythm. I had not seen the patient during this hospitalization, but had provided following recommendations pending outpatient follow up. EF was normal on echocardiogram. Although CHA2DS2VASc score is high at 5, I felt it was okay to omit anticoagulation given very short duration in the setting of COVID, and continue DAPT as his baseline TIA management. Will need to investigate what duration if DAPT is required from h/o TIA standpoint. After COVID isolation is completed, will arrange outpatient office visit and monitor.    Patient is here for hospital follow-up visit today.  He and his wife moved from [place] to [place] to be at [employer].  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  He has known CAD with LAD stent in 2003, with subsequent caths showing nonobstructive disease, most recently in 2014.  Presently, he stays active with walking, taking care of his wife as primary caregiver.  He denies any complaints of chest pain, shortness of breath.  He also denies any palpitations and did not feel any symptoms during his short lasting episode of A. fib during COVID hospitalization in 10/2021.    Patient had an episode of TIA in 2017, where he had  difficulty reading newspaper worse.  He has been on aspirin and Plavix since then.  Most recently, he underwent MRI brain in 07/2021, that showed chronic microvascular changes, but no evidence of prior stroke.  Patient has not had lipid panel in over a year.  Previously, he did not tolerate Crestor due to myalgias, and is therefore on pravastatin.   Current Outpatient Medications:    ALPRAZolam (XANAX) 1 MG tablet, Take 1 mg by mouth daily as needed., Disp: , Rfl:    amLODipine (NORVASC) 2.5 MG tablet, Take 2.5 mg by mouth daily., Disp: , Rfl:    buPROPion (WELLBUTRIN XL) 300 MG 24 hr tablet, Take 300 mg by mouth daily., Disp: , Rfl:    Carbidopa-Levodopa ER (SINEMET CR) 25-100 MG tablet controlled release, Take at 7 AM, noon and 5 PM daily., Disp: 270 tablet, Rfl: 1   clopidogrel (PLAVIX) 75 MG tablet, Take 75 mg by mouth daily., Disp: , Rfl:    dicyclomine (BENTYL) 10 MG capsule, TAKE ONE CAPSULE BY MOUTH EVERY MORNING, Disp: 30 capsule, Rfl: 3   levothyroxine (SYNTHROID) 75 MCG tablet, Take 1 tablet (75 mcg total) by mouth daily before breakfast., Disp: 30 tablet, Rfl: 6   mirtazapine (REMERON) 45 MG tablet, Take 45 mg by mouth at bedtime., Disp: , Rfl:    nitroGLYCERIN (NITROSTAT) 0.4 MG SL tablet, Place 1 tablet (0.4 mg total) under the tongue every 5 (five) minutes as needed for chest pain., Disp: 30 tablet, Rfl: 3   omeprazole (PRILOSEC) 20 MG capsule, Take 20 mg by mouth daily.,  Disp: , Rfl:    thyroid (ARMOUR) 60 MG tablet, Take 60 mg by mouth daily before breakfast., Disp: , Rfl:    Cholecalciferol (DIALYVITE VITAMIN D 5000) 125 MCG (5000 UT) capsule, Take 5,000 Units by mouth daily. (Patient not taking: Reported on 02/24/2023), Disp: , Rfl:   Cardiovascular and other pertinent studies:  EKG 04/02/2023: Sinus rhythm 75 bpm First degree A-V block   Modified Bruce exercise Tetrofosmin stress test 09/04/2022: Exercise nuclear stress test was performed using modified Bruce protocol. Patient  reached 3.7 METS, and 85% of age predicted maximum heart rate. Exercise capacity was low. No chest pain reported. Heart rate and hemodynamic response were normal. Stress EKG revealed no ischemic changes. Normal myocardial perfusion. Stress LVEF 65%. Low risk study.    EKG 08/25/2022: Sinus rhythm 64 bpm Occasional PAC   Nonspecific T-abnormality  Mobile cardiac telemetry 13 days 12/13/2021 - 12/27/2021: Dominant rhythm: Sinus. HR 40-130 bpm. Avg HR 69 bpm, in sinus rhythm with first degree AV block. 12 episodes of SVT, fastest at 154 bpm for 8 beats, longest for 12 beats at 114 bpm. <1% isolated SVE, couplet/triplets. 3 episodes listed as VT, more likely to be SVT with aberrent conduction, fastest at 190 bpm for 5 beats, longest for 19 beats at 113 bpm. <1% isolated VE, couplet/triplets. No atrial fibrillation/atrial flutter/high grade AV block, sinus pause >3sec noted. 1 patient triggered event, correlated with sinus rhythm.   Echocardiogram 11/18/2021: 1. Left ventricular ejection fraction, by estimation, is 60 to 65%. The  left ventricle has normal function. The left ventricle has no regional  wall motion abnormalities.   2. Right ventricular systolic function is normal. The right ventricular  size is normal. There is normal pulmonary artery systolic pressure.   3. The mitral valve is myxomatous. Trivial mitral valve regurgitation. No  evidence of mitral stenosis. There is mild late systolic prolapse of both  leaflets of the mitral valve.   4. The aortic valve is normal in structure. Aortic valve regurgitation is  not visualized. No aortic stenosis is present.   5. The inferior vena cava is normal in size with greater than 50%  respiratory variability, suggesting right atrial pressure of 3 mmHg.    MRI brain 07/30/2021: 1. Mild white matter disease, likely ischemic and due to microvascular disease. This has  progressed very slightly since the prior MRI.  2. Mild mucosal thickening  ethmoid air cells bilaterally.  Recent labs: 02/03/2023: Glucose 119, BUN/Cr 13/0.8. EGFR 88. Na/K 138/4.2. AlKP 159. Rest of the CMP normal Chol 187, TG 109, HDL 58, LDL 110 TSH 4.3 normal  08/17/2022: Glucose 90, BUN/Cr 9/0.78. EGFR 88. Na/K 140/4.4. Rest of the CMP normal H/H 15/45. MCV 91. Platelets 264 HbA1C NA HS trop 10  11/18/2021: Glucose 111, BUN/Cr 10/0.93. EGFR >60. Na/K 134/3.7.  H/H 13/38. MCV 90. Platelets 278 HbA1C NA Chol 141, TG 61, HDL 42, LDL 87 TSH NA    Review of Systems  Cardiovascular:  Negative for chest pain, dyspnea on exertion, leg swelling, palpitations and syncope.         Vitals:   04/02/23 1254  BP: 114/69  Pulse: 72  SpO2: 95%     Body mass index is 24.05 kg/m. Filed Weights   04/02/23 1254  Weight: 170 lb (77.1 kg)     Objective:   Physical Exam Vitals and nursing note reviewed.  Constitutional:      General: He is not in acute distress. Neck:     Vascular:  No JVD.  Cardiovascular:     Rate and Rhythm: Normal rate and regular rhythm.     Heart sounds: Normal heart sounds. No murmur heard. Pulmonary:     Effort: Pulmonary effort is normal.     Breath sounds: Normal breath sounds. No wheezing or rales.  Musculoskeletal:     Right lower leg: No edema.     Left lower leg: No edema.         Assessment & Recommendations:   82 year old Caucasian male with CAD, remote h/o PCI, hypothyroidism, prostate cancer, brief A. fib episode during COVID hospitalization in 10/2021  CAD: Remote history. One angina episode. Exercise nuclear stress test with 3.7 METS, no ischemia (08/2022). Chol 187, TG 109, HDL 58, LDL 110 (01/2023). Increase Crestor to 20 mg daily. Repeat lipid panel in 3 months. Continue plavix. (CAD, h/o TIA, tolerating monotherapy with plavix without bleeding issues. Reasonable to continue). Prescribed SL NTG.   Hypertension: Well controlled.  Afib: While CHA2DS2VSc score is 5, he has not had any other  known A-fib episodes, other than brief episode <24 hrs during COVID hospitalization in 10/2021. No A-fib seen on to be cardiac telemetry.  I have encouraged him to consider wearing a smart watch or home monitoring.   Okay to hold off anticoagulation, unless recurrent Afib.  H/o TIA: Remote history. MRI brain 07/2021 showed no stroke, shows chronic microvascular changes. I do not think he needs DAPT. Continue plavix.   F/u in 3 months    Elder Negus, MD Pager: [phone] Office: [phone]

## 2023-05-19 ENCOUNTER — Ambulatory Visit: Payer: Medicare Other | Admitting: Internal Medicine

## 2023-05-19 ENCOUNTER — Encounter: Payer: Self-pay | Admitting: Internal Medicine

## 2023-05-19 VITALS — BP 120/70 | HR 69 | Temp 97.2°F | Resp 18 | Ht 71.0 in | Wt 173.0 lb

## 2023-05-19 DIAGNOSIS — F419 Anxiety disorder, unspecified: Secondary | ICD-10-CM

## 2023-05-19 DIAGNOSIS — I25118 Atherosclerotic heart disease of native coronary artery with other forms of angina pectoris: Secondary | ICD-10-CM | POA: Diagnosis not present

## 2023-05-19 DIAGNOSIS — I4891 Unspecified atrial fibrillation: Secondary | ICD-10-CM

## 2023-05-19 DIAGNOSIS — E039 Hypothyroidism, unspecified: Secondary | ICD-10-CM

## 2023-05-19 DIAGNOSIS — E782 Mixed hyperlipidemia: Secondary | ICD-10-CM

## 2023-05-19 DIAGNOSIS — Z0000 Encounter for general adult medical examination without abnormal findings: Secondary | ICD-10-CM

## 2023-05-19 DIAGNOSIS — Z6824 Body mass index (BMI) 24.0-24.9, adult: Secondary | ICD-10-CM | POA: Diagnosis not present

## 2023-05-19 DIAGNOSIS — G4733 Obstructive sleep apnea (adult) (pediatric): Secondary | ICD-10-CM

## 2023-05-20 ENCOUNTER — Other Ambulatory Visit: Payer: Self-pay | Admitting: Internal Medicine

## 2023-05-20 MED ORDER — ROSUVASTATIN CALCIUM 40 MG PO TABS: 40.0000 mg | ORAL_TABLET | Freq: Every day | ORAL | 6 refills | Status: DC

## 2023-05-20 MED ORDER — SHINGRIX 50 MCG/0.5ML IM SUSR: 0.5000 mL | Freq: Once | INTRAMUSCULAR | 0 refills | Status: AC

## 2023-05-20 NOTE — Assessment & Plan Note (Signed)
He will take mirtazapine I am, Wellbutrin and Xanax as needed.

## 2023-05-20 NOTE — Assessment & Plan Note (Signed)
His heart rate is controlled.  He is not on any anticoagulation.

## 2023-05-20 NOTE — Progress Notes (Addendum)
Office Visit  Subjective   Patient ID: Robert Horne   DOB: Jan 25, 1941   Age: 82 y.o.   MRN: [id number]   Chief Complaint Chief Complaint  Patient presents with   Annual Exam    Medicare annual exam     History of Present Illness 82 years old male who is here for annual wellness visit.  He has moved to [facility] independent living.  He is independent in activities of daily living, he does not drive because of his concern of safety.  He walks for exercise reason.  He does not get any shortness of breath or chest pain when you walk. He uses hearing aid. He has no fall.  And no balance problem. He says that he can write checks and quite independent in everyday activity.  He scored 29 out of 30 on Mini-Mental status only point was taken off he does not know the date.  His clock draw was good and he scored 0 on PHQ 2. He does not get flu shot because of bad reaction.  He does have the pneumonia vaccine and COVID vaccine.  He has a shingle vaccine long time ago but wanted to have a new vaccine. He has a coronary artery disease, atrial fibrillation and he follows with cardiologist every 6 months. He has a history of prostate cancer and woke up once at nighttime to urinate.    He does have a history of obstructive sleep apnea.  Patient says that that was 1015 years ago and he has not used any CPAP since then.  He says that he is sleep good woke up only once to urinate and when you wake up in the morning he feels fresh.  He also has anxiety and dyslipidemia.  His lipid panel was done in March 24 shows that his LDL is not at target control.  He take rosuvastatin 20 mg daily.  Need to repeat his lipid panel. He has hypothyroidism and he take levothyroxine 75 mill micrograms daily.  His TSH on March 12 was 4.3 that was therapeutic.  Past Medical History Past Medical History:  Diagnosis Date   Cancer [facility])    prostate, 2002   H/O heart artery stent    Hypothyroidism      Allergies No Known  Allergies   Review of Systems Review of Systems  Constitutional: Negative.   HENT:  Positive for hearing loss.   Eyes: Negative.   Respiratory: Negative.    Cardiovascular: Negative.   Gastrointestinal: Negative.   Neurological: Negative.        Objective:    Vitals BP 120/70 (BP Location: Left Arm, Patient Position: Sitting, Cuff Size: Normal)   Pulse 69   Temp (!) 97.2 F (36.2 C)   Resp 18   Ht 5\' 11"  (1.803 m)   Wt 173 lb (78.5 kg)   SpO2 96%   BMI 24.13 kg/m    Physical Examination Physical Exam Constitutional:      Appearance: Normal appearance.  HENT:     Head: Normocephalic and atraumatic.  Eyes:     Extraocular Movements: Extraocular movements intact.     Pupils: Pupils are equal, round, and reactive to light.  Cardiovascular:     Rate and Rhythm: Normal rate and regular rhythm.     Heart sounds: Normal heart sounds.  Pulmonary:     Effort: Pulmonary effort is normal.     Breath sounds: Normal breath sounds.  Abdominal:     General: Bowel sounds are normal.  Palpations: Abdomen is soft.  Neurological:     General: No focal deficit present.     Mental Status: He is alert and oriented to person, place, and time.        Assessment & Plan:   Coronary artery disease of native artery of native heart with stable angina pectoris [facility]) He will continue to follow with a cardiologist.  Atrial fibrillation with rapid ventricular response (HCC) His heart rate is controlled.  He is not on any anticoagulation.  Severe obstructive sleep apnea Per record he has a severe central sleep apnea but patient says that he has not been using any CPAP and he is sleep good and when he wake up he feels fresh.  Hypothyroidism His TSH is in therapeutic range.  Hyperlipidemia Because of his history of TIA and coronary artery disease his LDL is not at target control and was 110 in March 2024.  I have discussed with him and I will increase the dose of rosuvastatin to 40  mg daily.  Anxiety He will take mirtazapine I am, Wellbutrin and Xanax as needed.    Return in about 3 months (around 08/19/2023).   [name], MD

## 2023-05-20 NOTE — Assessment & Plan Note (Signed)
His TSH is in therapeutic range.

## 2023-05-20 NOTE — Assessment & Plan Note (Signed)
Per record he has a severe central sleep apnea but patient says that he has not been using any CPAP and he is sleep good and when he wake up he feels fresh.

## 2023-05-20 NOTE — Assessment & Plan Note (Signed)
Because of his history of TIA and coronary artery disease his LDL is not at target control and was 110 in March 2024.  I have discussed with him and I will increase the dose of rosuvastatin to 40 mg daily.

## 2023-05-20 NOTE — Assessment & Plan Note (Signed)
He will continue to follow with a cardiologist.

## 2023-06-28 ENCOUNTER — Other Ambulatory Visit: Payer: Self-pay | Admitting: Nurse Practitioner

## 2023-07-02 ENCOUNTER — Ambulatory Visit: Payer: Medicare Other | Admitting: Neurology

## 2023-07-02 LAB — LIPID PANEL
Chol/HDL Ratio: 2.2 ratio (ref 0.0–5.0)
Cholesterol, Total: 130 mg/dL (ref 100–199)
HDL: 58 mg/dL
LDL Chol Calc (NIH): 57 mg/dL (ref 0–99)
Triglycerides: 77 mg/dL (ref 0–149)
VLDL Cholesterol Cal: 15 mg/dL (ref 5–40)

## 2023-07-06 ENCOUNTER — Ambulatory Visit: Payer: Medicare Other | Admitting: Cardiology

## 2023-07-08 NOTE — Telephone Encounter (Signed)
Done

## 2023-07-23 ENCOUNTER — Ambulatory Visit: Payer: Medicare Other | Admitting: Cardiology

## 2023-07-23 ENCOUNTER — Encounter: Payer: Self-pay | Admitting: Cardiology

## 2023-07-23 VITALS — BP 124/67 | HR 67 | Resp 16 | Ht 71.0 in | Wt 173.0 lb

## 2023-07-23 DIAGNOSIS — I340 Nonrheumatic mitral (valve) insufficiency: Secondary | ICD-10-CM

## 2023-07-23 DIAGNOSIS — I25118 Atherosclerotic heart disease of native coronary artery with other forms of angina pectoris: Secondary | ICD-10-CM

## 2023-07-23 DIAGNOSIS — E782 Mixed hyperlipidemia: Secondary | ICD-10-CM

## 2023-07-23 DIAGNOSIS — I471 Supraventricular tachycardia, unspecified: Secondary | ICD-10-CM

## 2023-07-23 NOTE — Progress Notes (Signed)
Patient referred by [name], MD for atrial fibrillation  Subjective:   Robert Horne, male    DOB: 12/16/1940, 82 y.o.   MRN: [id number]   Chief Complaint  Patient presents with   Coronary Artery Disease   Follow-up     HPI   82 year old Caucasian male with CAD, remote h/o PCI, hypothyroidism, prostate cancer, brief A. fib episode during COVID hospitalization in 10/2021  Patient is doing well, denies chest pain, shortness of breath, palpitations, leg edema, orthopnea, PND, TIA/syncope.Reviewed recent test results with the patient, details below.   Office note 11/2021: Patient had brief Afib episode for <24 hrs, self converted to sinus rhythm. I had not seen the patient during this hospitalization, but had provided following recommendations pending outpatient follow up. EF was normal on echocardiogram. Although CHA2DS2VASc score is high at 5, I felt it was okay to omit anticoagulation given very short duration in the setting of COVID, and continue DAPT as his baseline TIA management. Will need to investigate what duration if DAPT is required from h/o TIA standpoint. After COVID isolation is completed, will arrange outpatient office visit and monitor.    Patient is here for hospital follow-up visit today.  He and his wife moved from [place] to [place] to be at [employer].  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  He has known CAD with LAD stent in 2003, with subsequent caths showing nonobstructive disease, most recently in 2014.  Presently, he stays active with walking, taking care of his wife as primary caregiver.  He denies any complaints of chest pain, shortness of breath.  He also denies any palpitations and did not feel any symptoms during his short lasting episode of A. fib during COVID hospitalization in 10/2021.    Patient had an episode of TIA in 2017, where he had difficulty reading newspaper worse.  He has  been on aspirin and Plavix since then.  Most recently, he underwent MRI brain in 07/2021, that showed chronic microvascular changes, but no evidence of prior stroke.  Patient has not had lipid panel in over a year.  Previously, he did not tolerate Crestor due to myalgias, and is therefore on pravastatin.   Current Outpatient Medications:    ALPRAZolam (XANAX) 1 MG tablet, Take 1 mg by mouth daily as needed., Disp: , Rfl:    amLODipine (NORVASC) 2.5 MG tablet, Take 2.5 mg by mouth daily., Disp: , Rfl:    buPROPion (WELLBUTRIN XL) 300 MG 24 hr tablet, Take 300 mg by mouth daily., Disp: , Rfl:    Carbidopa-Levodopa ER (SINEMET CR) 25-100 MG tablet controlled release, Take at 7 AM, noon and 5 PM daily., Disp: 270 tablet, Rfl: 1   Cholecalciferol (DIALYVITE VITAMIN D 5000) 125 MCG (5000 UT) capsule, Take 5,000 Units by mouth daily. (Patient not taking: Reported on 02/24/2023), Disp: , Rfl:    clopidogrel (PLAVIX) 75 MG tablet, Take 75 mg by mouth daily., Disp: , Rfl:    dicyclomine (BENTYL) 10 MG capsule, TAKE 1 CAPSULE BY MOUTH EVERY MORNING, Disp: 30 capsule, Rfl: 2   levothyroxine (SYNTHROID) 75 MCG tablet, Take 1 tablet (75 mcg total) by mouth daily before breakfast., Disp: 30 tablet, Rfl: 6   mirtazapine (REMERON) 45 MG tablet, Take 45 mg by mouth at bedtime., Disp: , Rfl:    nitroGLYCERIN (NITROSTAT) 0.4 MG SL tablet, Place 1 tablet (0.4 mg total) under the tongue every 5 (five) minutes as needed for  chest pain., Disp: 30 tablet, Rfl: 3   omeprazole (PRILOSEC) 20 MG capsule, Take 20 mg by mouth daily., Disp: , Rfl:    rosuvastatin (CRESTOR) 40 MG tablet, Take 1 tablet (40 mg total) by mouth daily., Disp: 30 tablet, Rfl: 6   thyroid (ARMOUR) 60 MG tablet, Take 60 mg by mouth daily before breakfast., Disp: , Rfl:   Cardiovascular and other pertinent studies:  EKG 04/02/2023: Sinus rhythm 75 bpm First degree A-V block   Modified Bruce exercise Tetrofosmin stress test 09/04/2022: Exercise nuclear  stress test was performed using modified Bruce protocol. Patient reached 3.7 METS, and 85% of age predicted maximum heart rate. Exercise capacity was low. No chest pain reported. Heart rate and hemodynamic response were normal. Stress EKG revealed no ischemic changes. Normal myocardial perfusion. Stress LVEF 65%. Low risk study.   Mobile cardiac telemetry 13 days 12/13/2021 - 12/27/2021: Dominant rhythm: Sinus. HR 40-130 bpm. Avg HR 69 bpm, in sinus rhythm with first degree AV block. 12 episodes of SVT, fastest at 154 bpm for 8 beats, longest for 12 beats at 114 bpm. <1% isolated SVE, couplet/triplets. 3 episodes listed as VT, more likely to be SVT with aberrent conduction, fastest at 190 bpm for 5 beats, longest for 19 beats at 113 bpm. <1% isolated VE, couplet/triplets. No atrial fibrillation/atrial flutter/high grade AV block, sinus pause >3sec noted. 1 patient triggered event, correlated with sinus rhythm.   Echocardiogram 11/18/2021: 1. Left ventricular ejection fraction, by estimation, is 60 to 65%. The  left ventricle has normal function. The left ventricle has no regional  wall motion abnormalities.   2. Right ventricular systolic function is normal. The right ventricular  size is normal. There is normal pulmonary artery systolic pressure.   3. The mitral valve is myxomatous. Trivial mitral valve regurgitation. No  evidence of mitral stenosis. There is mild late systolic prolapse of both  leaflets of the mitral valve.   4. The aortic valve is normal in structure. Aortic valve regurgitation is  not visualized. No aortic stenosis is present.   5. The inferior vena cava is normal in size with greater than 50%  respiratory variability, suggesting right atrial pressure of 3 mmHg.    MRI brain 07/30/2021: 1. Mild white matter disease, likely ischemic and due to microvascular disease. This has  progressed very slightly since the prior MRI.  2. Mild mucosal thickening ethmoid air cells  bilaterally.  Recent labs: 07/01/2023: Chol 130, TG 77, HDL 58, LDL 57  02/03/2023: Glucose 119, BUN/Cr 13/0.8. EGFR 88. Na/K 138/4.2. AlKP 159. Rest of the CMP normal Chol 187, TG 109, HDL 58, LDL 110 TSH 4.3 normal  08/17/2022: Glucose 90, BUN/Cr 9/0.78. EGFR 88. Na/K 140/4.4. Rest of the CMP normal H/H 15/45. MCV 91. Platelets 264 HbA1C NA HS trop 10  11/18/2021: Glucose 111, BUN/Cr 10/0.93. EGFR >60. Na/K 134/3.7.  H/H 13/38. MCV 90. Platelets 278 HbA1C NA Chol 141, TG 61, HDL 42, LDL 87 TSH NA    Review of Systems  Cardiovascular:  Negative for chest pain, dyspnea on exertion, leg swelling, palpitations and syncope.         Vitals:   07/23/23 1456  BP: 124/67  Pulse: 67  Resp: 16  SpO2: 96%      Body mass index is 24.13 kg/m. Filed Weights   07/23/23 1456  Weight: 173 lb (78.5 kg)      Objective:   Physical Exam Vitals and nursing note reviewed.  Constitutional:  General: He is not in acute distress. Neck:     Vascular: No JVD.  Cardiovascular:     Rate and Rhythm: Normal rate and regular rhythm.     Heart sounds: Murmur heard.     High-pitched blowing holosystolic murmur is present with a grade of 2/6 at the apex.  Pulmonary:     Effort: Pulmonary effort is normal.     Breath sounds: Normal breath sounds. No wheezing or rales.  Musculoskeletal:     Right lower leg: No edema.     Left lower leg: No edema.         Assessment & Recommendations:   82 year old Caucasian male with CAD, remote h/o PCI, hypothyroidism, prostate cancer, brief A. fib episode during COVID hospitalization in 10/2021  CAD: Remote history. One angina episode. Exercise nuclear stress test with 3.7 METS, no ischemia (08/2022). Chol 187, TG 109, HDL 58, LDL 110 (01/2023). Chol 130, TG 77, HDL 58, LDL 57 (05/2023). Continue Crestor 20 mg daily. Continue plavix. (CAD, h/o TIA, tolerating monotherapy with plavix without bleeding issues. Reasonable to  continue).  Mitral regurgitation: Mid to late systolic murmur. Clinically asymptomatic. Will check echocardiogram for monitoring.   Hypertension: Well controlled.  Afib: While CHA2DS2VSc score is 5, he has not had any other known A-fib episodes, other than brief episode <24 hrs during COVID hospitalization in 10/2021. No A-fib seen on to be cardiac telemetry.  I have encouraged him to consider wearing a smart watch or home monitoring.   Okay to hold off anticoagulation, unless recurrent Afib.  H/o TIA: Remote history. MRI brain 07/2021 showed no stroke, shows chronic microvascular changes. I do not think he needs DAPT. Continue plavix.   F/u in 6 months    Elder Negus, MD Pager: [phone] Office: [phone]

## 2023-08-10 ENCOUNTER — Other Ambulatory Visit: Payer: Self-pay | Admitting: Internal Medicine

## 2023-08-16 ENCOUNTER — Other Ambulatory Visit: Payer: Self-pay | Admitting: Neurology

## 2023-08-18 ENCOUNTER — Ambulatory Visit: Payer: Medicare Other | Admitting: Internal Medicine

## 2023-08-18 ENCOUNTER — Encounter: Payer: Self-pay | Admitting: Internal Medicine

## 2023-08-18 VITALS — BP 120/70 | HR 77 | Resp 18 | Ht 71.0 in | Wt 173.4 lb

## 2023-08-18 DIAGNOSIS — E785 Hyperlipidemia, unspecified: Secondary | ICD-10-CM

## 2023-08-18 DIAGNOSIS — F419 Anxiety disorder, unspecified: Secondary | ICD-10-CM | POA: Diagnosis not present

## 2023-08-18 DIAGNOSIS — E039 Hypothyroidism, unspecified: Secondary | ICD-10-CM | POA: Diagnosis not present

## 2023-08-18 DIAGNOSIS — I25118 Atherosclerotic heart disease of native coronary artery with other forms of angina pectoris: Secondary | ICD-10-CM

## 2023-08-18 DIAGNOSIS — F3342 Major depressive disorder, recurrent, in full remission: Secondary | ICD-10-CM

## 2023-08-18 DIAGNOSIS — I471 Supraventricular tachycardia, unspecified: Secondary | ICD-10-CM | POA: Diagnosis not present

## 2023-08-18 NOTE — Assessment & Plan Note (Signed)
better

## 2023-08-18 NOTE — Assessment & Plan Note (Addendum)
His heart rate is controlled, he is not on any blood thinner. He is in sinus rhythm today.

## 2023-08-18 NOTE — Progress Notes (Signed)
Office Visit  Subjective   Patient ID: Robert Horne   DOB: 1941-09-24   Age: 82 y.o.   MRN: [id number]   Chief Complaint Chief Complaint  Patient presents with   Follow-up    3 month follow up     History of Present Illness 82 years old male is here for follow up. He has lipid panel done in July and his LDL was target control. He could not tolerate crestor due to muscle pain but he takes pravastatin. He saw cardiologist and they have schedule him for echo. He has brief episode of AF but he is not on any anticoagulation.   He has hypothyroidism and he takes levothyroxine and his TSH in 3/24 was therapeutic.  He has anxiety and he occasionally takes xanax may be once every 2 weeks.  He has parkinsonism   Past Medical History Past Medical History:  Diagnosis Date   Cancer [facility])    prostate, 2002   H/O heart artery stent    Hypothyroidism      Allergies No Known Allergies   Review of Systems Review of Systems  Constitutional: Negative.   HENT: Negative.    Respiratory: Negative.    Cardiovascular: Negative.   Gastrointestinal: Negative.   Neurological: Negative.        Objective:    Vitals BP 120/70 (BP Location: Left Arm, Patient Position: Sitting)   Pulse 77   Resp 18   Ht 5\' 11"  (1.803 m)   Wt 173 lb 6 oz (78.6 kg)   SpO2 97%   BMI 24.18 kg/m    Physical Examination Physical Exam Constitutional:      Appearance: Normal appearance.  HENT:     Head: Normocephalic and atraumatic.  Cardiovascular:     Rate and Rhythm: Normal rate and regular rhythm.     Heart sounds: Normal heart sounds.  Pulmonary:     Effort: Pulmonary effort is normal.     Breath sounds: Normal breath sounds.  Abdominal:     General: Bowel sounds are normal.     Palpations: Abdomen is soft.  Neurological:     General: No focal deficit present.     Mental Status: He is alert and oriented to person, place, and time.        Assessment & Plan:   Coronary artery disease of  native artery of native heart with stable angina pectoris (HCC) stable  PSVT (paroxysmal supraventricular tachycardia) (HCC) His heart rate is controlled, he is not on any blood thinner. He is in sinus rhythm today.   Hypothyroidism Clinically euthyroid on levothyroxine 75 mcg daily.  Anxiety better  Depression He is doing better, so will monitor.  Dyslipidemia Well controlled on pravastatin 20 mg daily.    Return in about 3 months (around 11/17/2023).   [name], MD

## 2023-08-18 NOTE — Assessment & Plan Note (Signed)
Well controlled on pravastatin 20 mg daily.

## 2023-08-18 NOTE — Assessment & Plan Note (Signed)
Clinically euthyroid on levothyroxine 75 mcg daily.

## 2023-08-18 NOTE — Assessment & Plan Note (Signed)
stable °

## 2023-08-18 NOTE — Assessment & Plan Note (Signed)
He is doing better, so will monitor.

## 2023-08-26 ENCOUNTER — Ambulatory Visit (INDEPENDENT_AMBULATORY_CARE_PROVIDER_SITE_OTHER): Payer: Medicare Other | Admitting: Neurology

## 2023-08-26 VITALS — BP 112/69 | HR 78 | Ht 71.0 in | Wt 173.6 lb

## 2023-08-26 DIAGNOSIS — G20C Parkinsonism, unspecified: Secondary | ICD-10-CM

## 2023-08-26 DIAGNOSIS — K5909 Other constipation: Secondary | ICD-10-CM

## 2023-08-26 DIAGNOSIS — Z9181 History of falling: Secondary | ICD-10-CM | POA: Diagnosis not present

## 2023-08-26 DIAGNOSIS — R413 Other amnesia: Secondary | ICD-10-CM | POA: Diagnosis not present
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
## 2023-08-26 NOTE — Patient Instructions (Addendum)
It was nice to see you again today.   I would like to maintain you on the current medication regimen with levodopa long-acting 3 times a day.   Please be really proactive with your constipation medication regimen to where you have a formed stool at least every other day. You can use over-the-counter probiotic yogurt or pills, add fiber in the form of Metamucil, and use a stool softener or if needed a laxative, even daily if necessary.  Please increase your water intake to 6-8 cups/day. Please be cautious with alcohol and limit to less than 1 serving per day. I am worried about your driving. Please limit to only local roads, familiar routes, no nighttime and no highway driving.  We may consider a medication for memory loss in the near future.  I would like for you to follow-up routinely to see one of our nurse practitioners in our clinic in 6 to 8 months, sooner if needed.

## 2023-08-31 IMAGING — DX DG SHOULDER 2+V PORT*R*
1 series · 1 of 1 positions shown · non-contrast
Comparison: 09/20/2021

CLINICAL DATA: Status post right shoulder arthroplasty

EXAM:
PORTABLE RIGHT SHOULDER

[shoulder ap]
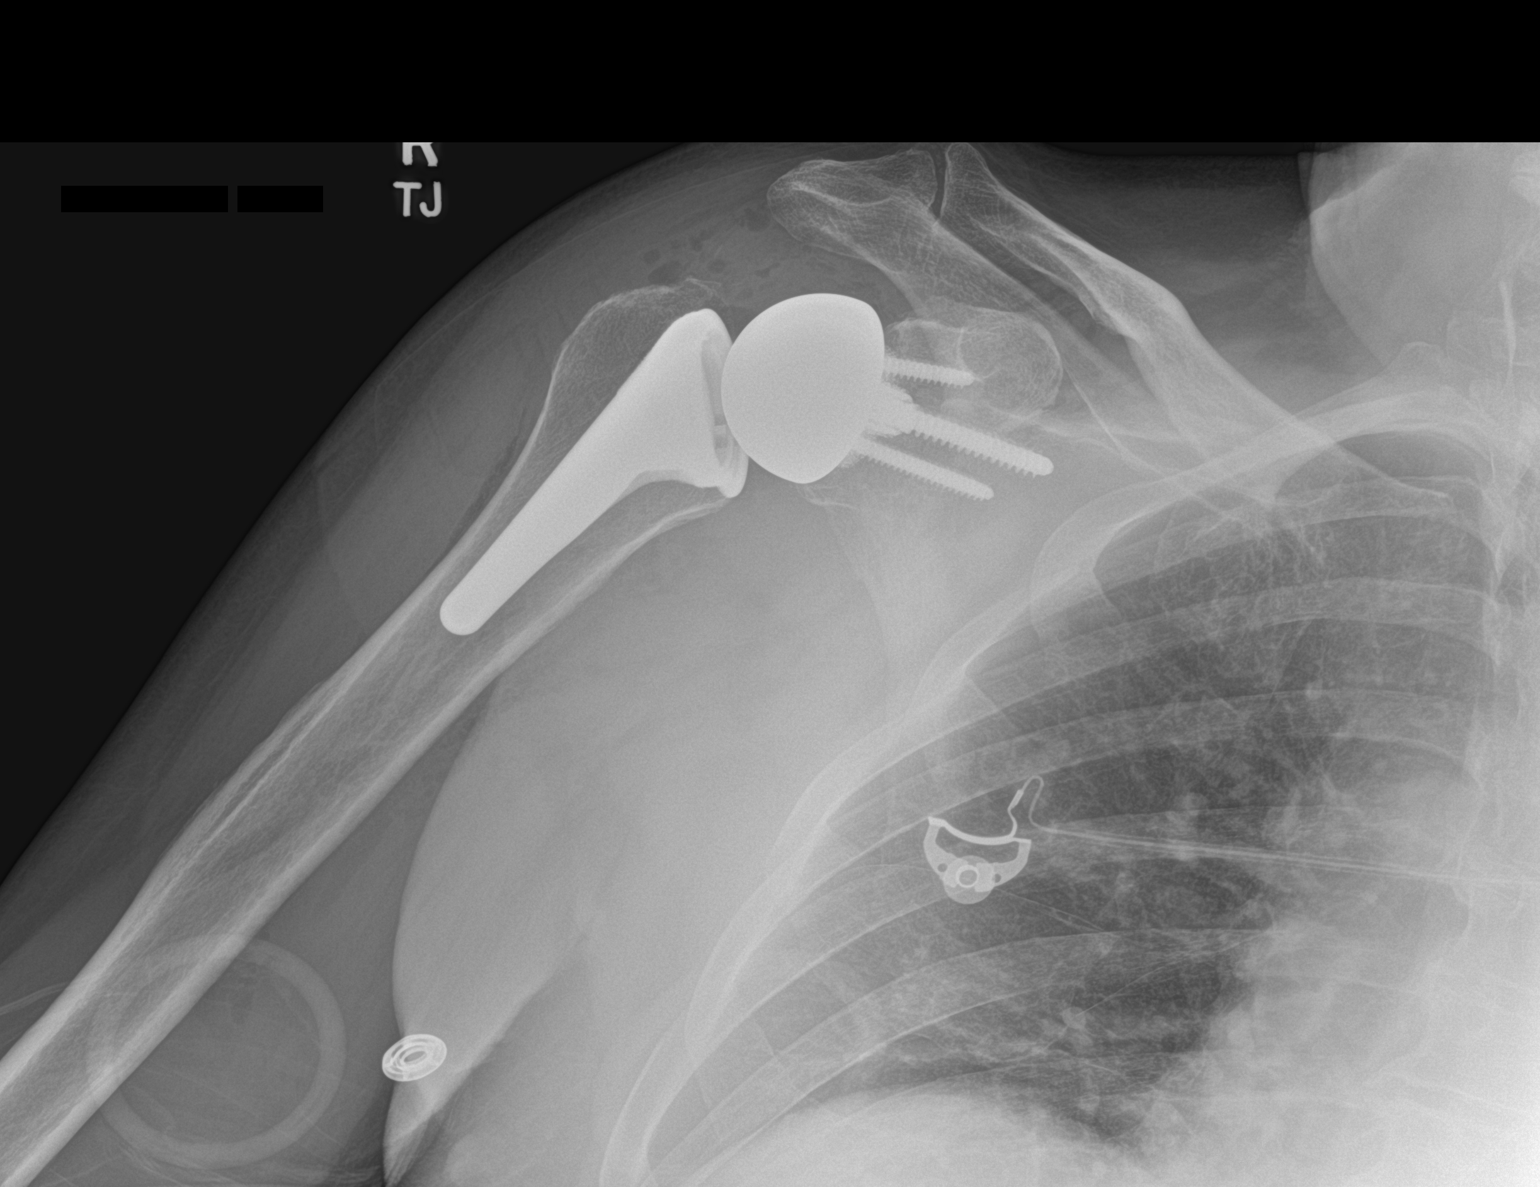

[1 of 1 positions shown; findings below may reference images not displayed]

FINDINGS: Status post right shoulder reverse arthroplasty with expected
overlying postoperative change. No evidence of perihardware fracture
or component malpositioning.
IMPRESSION: Status post right shoulder reverse arthroplasty with expected
overlying postoperative change. No evidence of perihardware fracture
or component malpositioning.

## 2023-09-07 ENCOUNTER — Other Ambulatory Visit: Payer: Medicare Other

## 2023-09-07 ENCOUNTER — Ambulatory Visit (HOSPITAL_COMMUNITY): Payer: Medicare Other | Attending: Internal Medicine

## 2023-09-07 DIAGNOSIS — I340 Nonrheumatic mitral (valve) insufficiency: Secondary | ICD-10-CM | POA: Diagnosis not present

## 2023-09-07 LAB — ECHOCARDIOGRAM COMPLETE
Area-P 1/2: 2.8 cm2
S' Lateral: 2.8 cm

## 2023-09-07 NOTE — Progress Notes (Signed)
Echocardiogram is essentially normal.

## 2023-09-09 ENCOUNTER — Telehealth: Payer: Self-pay | Admitting: Cardiology

## 2023-09-09 NOTE — Telephone Encounter (Signed)
Reviewed normal echocardiogram results, patient verbalizes understanding.

## 2023-09-09 NOTE — Telephone Encounter (Signed)
Patient is returning call in regards to results. Requesting call back. 

## 2023-09-11 IMAGING — DX DG CHEST 1V PORT
1 series · 1 of 1 positions shown · non-contrast
Comparison: September 20, 2021

CLINICAL DATA: sob

EXAM:
PORTABLE CHEST 1 VIEW

[chest ap]
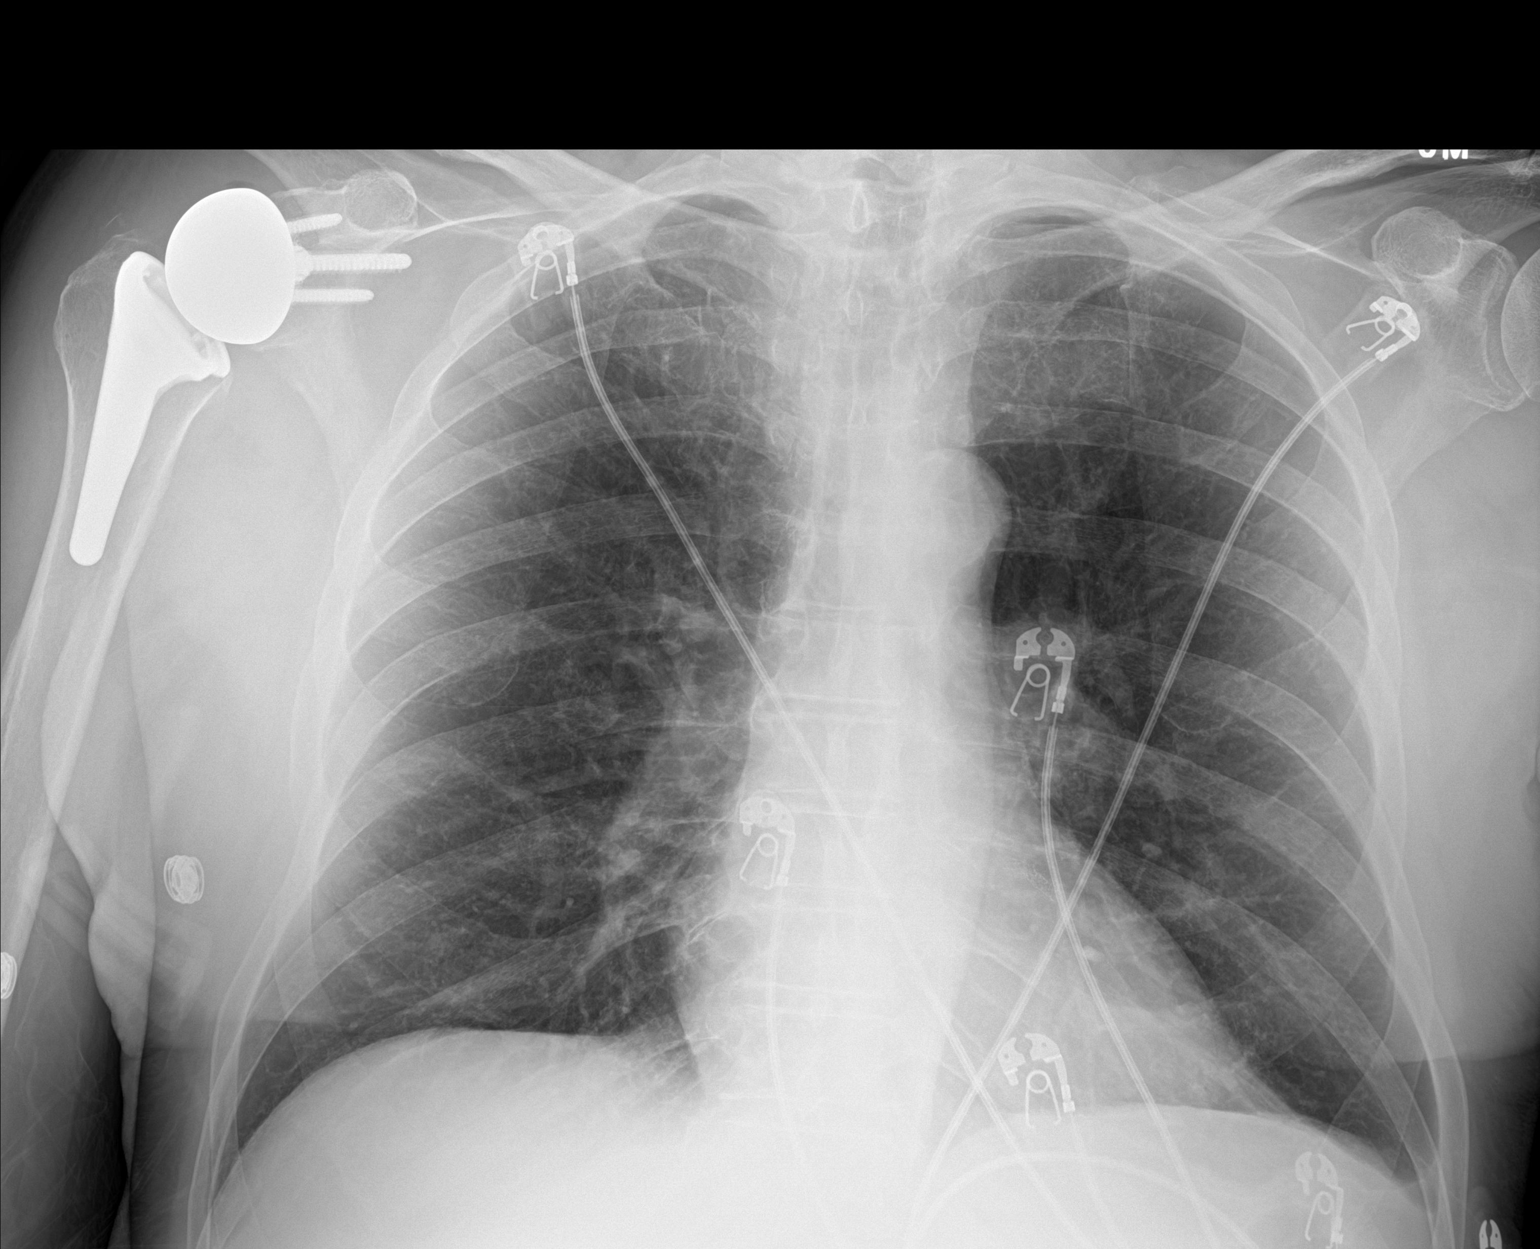

[1 of 1 positions shown; findings below may reference images not displayed]

FINDINGS: The cardiomediastinal silhouette is unchanged in contour. No pleural
effusion. No pneumothorax. No acute pleuroparenchymal abnormality.
Visualized abdomen is unremarkable. Status post RIGHT shoulder
arthroplasty. There is a trace amount of air along the superior
aspect of the glenoid aspect of the arthroplasty which is likely
postsurgical in etiology.
IMPRESSION: 1.  No acute cardiopulmonary abnormality.

2. There is a trace amount of air along the superior aspect of the
RIGHT shoulder glenoid aspect of the arthroplasty which is likely
postsurgical in etiology. Correlate with clinical symptomatology.

## 2023-09-15 ENCOUNTER — Telehealth: Payer: Self-pay | Admitting: Neurology

## 2023-09-15 NOTE — Telephone Encounter (Signed)
I called pt and LMVM for him to return call after 1300.

## 2023-09-15 NOTE — Telephone Encounter (Signed)
Pt said last night began having tremors in head, neck and left arm. Was so bad could not pull up the covers, also having several falls since last office visit appointment.

## 2023-09-15 NOTE — Telephone Encounter (Signed)
I called pt, LMVM for him that will touch base with him tomorrow.   Someone did pick up, but would not answer (heard background voices).

## 2023-09-15 NOTE — Telephone Encounter (Signed)
Pt has returned the call to [place], [place] please call pt back.  His phone was on do not disturb by accident.

## 2023-09-16 NOTE — Telephone Encounter (Signed)
I called pt and relayed Dr. Teofilo Pod message, relating below, hydrate well, limit caffiene, continue PD med regiment, avoid alcohol. Isolated incident.  Monitor.  He verbalized understanding.

## 2023-09-16 NOTE — Telephone Encounter (Signed)
Pt returned call.  He reported that Monday early am 0100 he was awakened with tremors, not only his Larm (which is his usual tremor) but head, neck, back, R arm, tongue.  He could not pull his blanket up.  He is taking sinemet ER 25-100mg  1 tid.  He did not have any alcohol, was not fatigued.  Tremors lasted till 0800 next morning when he awakened (as far as he knows).  He did take xanax (as aid to sleep after he had awakened).   Has not happened again.  Only med change was increased wellbutrin to 150mg  po tid (2 wks prior).  He states that when he strains he does note the L tremor.  Otherwise he has had good control with previous regimen.  He was last seen 08-26-2023.  Any recommendations?

## 2023-09-16 NOTE — Telephone Encounter (Signed)
I would recommend maintaining his parkinson's medication regimen and monitor his symptoms.  I recommend he stay well hydrated throughout the day, 6-8 cups per day, limit caffeine to less than 2 servings per day, avoid alcohol altogether.

## 2023-09-17 ENCOUNTER — Other Ambulatory Visit: Payer: Self-pay | Admitting: Neurology

## 2023-09-17 NOTE — Telephone Encounter (Signed)
Last seen on 08/26/23 per note " I would like to maintain you on the current medication regimen with levodopa long-acting 3 times a day.    Follow up scheduled on 03/10/24 Last filled on 08/20/23 #90 tablets (30 day supply)

## 2023-09-19 ENCOUNTER — Emergency Department (HOSPITAL_COMMUNITY): Payer: Medicare Other

## 2023-09-19 ENCOUNTER — Encounter (HOSPITAL_COMMUNITY): Payer: Self-pay

## 2023-09-19 ENCOUNTER — Telehealth: Payer: Self-pay | Admitting: Neurology

## 2023-09-19 ENCOUNTER — Inpatient Hospital Stay (HOSPITAL_COMMUNITY): Payer: Medicare Other

## 2023-09-19 ENCOUNTER — Other Ambulatory Visit: Payer: Self-pay

## 2023-09-19 ENCOUNTER — Inpatient Hospital Stay (HOSPITAL_COMMUNITY)
Admission: EM | Admit: 2023-09-19 | Discharge: 2023-09-21 | DRG: 083 | Disposition: A | Discharge status: Home Health | Payer: Medicare Other | Attending: Family Medicine | Admitting: Family Medicine

## 2023-09-19 DIAGNOSIS — S065XAA Traumatic subdural hemorrhage with loss of consciousness status unknown, initial encounter: Secondary | ICD-10-CM | POA: Diagnosis not present

## 2023-09-19 DIAGNOSIS — G20A1 Parkinson's disease without dyskinesia, without mention of fluctuations: Secondary | ICD-10-CM | POA: Diagnosis present

## 2023-09-19 DIAGNOSIS — G4733 Obstructive sleep apnea (adult) (pediatric): Secondary | ICD-10-CM | POA: Diagnosis present

## 2023-09-19 DIAGNOSIS — R5381 Other malaise: Secondary | ICD-10-CM | POA: Diagnosis present

## 2023-09-19 DIAGNOSIS — R001 Bradycardia, unspecified: Secondary | ICD-10-CM | POA: Diagnosis present

## 2023-09-19 DIAGNOSIS — I440 Atrioventricular block, first degree: Secondary | ICD-10-CM | POA: Diagnosis present

## 2023-09-19 DIAGNOSIS — W19XXXA Unspecified fall, initial encounter: Principal | ICD-10-CM | POA: Diagnosis present

## 2023-09-19 DIAGNOSIS — I480 Paroxysmal atrial fibrillation: Secondary | ICD-10-CM | POA: Diagnosis present

## 2023-09-19 DIAGNOSIS — I341 Nonrheumatic mitral (valve) prolapse: Secondary | ICD-10-CM | POA: Diagnosis present

## 2023-09-19 DIAGNOSIS — G90A Postural orthostatic tachycardia syndrome (POTS): Secondary | ICD-10-CM | POA: Diagnosis present

## 2023-09-19 DIAGNOSIS — G459 Transient cerebral ischemic attack, unspecified: Secondary | ICD-10-CM | POA: Diagnosis present

## 2023-09-19 DIAGNOSIS — Z9181 History of falling: Secondary | ICD-10-CM

## 2023-09-19 DIAGNOSIS — G9608 Other cranial cerebrospinal fluid leak: Secondary | ICD-10-CM | POA: Diagnosis present

## 2023-09-19 DIAGNOSIS — Z8616 Personal history of COVID-19: Secondary | ICD-10-CM | POA: Diagnosis not present

## 2023-09-19 DIAGNOSIS — R296 Repeated falls: Secondary | ICD-10-CM | POA: Diagnosis present

## 2023-09-19 DIAGNOSIS — R42 Dizziness and giddiness: Secondary | ICD-10-CM | POA: Diagnosis not present

## 2023-09-19 DIAGNOSIS — I493 Ventricular premature depolarization: Secondary | ICD-10-CM | POA: Diagnosis present

## 2023-09-19 DIAGNOSIS — Z9842 Cataract extraction status, left eye: Secondary | ICD-10-CM

## 2023-09-19 DIAGNOSIS — F4381 Prolonged grief disorder: Secondary | ICD-10-CM | POA: Diagnosis present

## 2023-09-19 DIAGNOSIS — Z9089 Acquired absence of other organs: Secondary | ICD-10-CM

## 2023-09-19 DIAGNOSIS — R4586 Emotional lability: Secondary | ICD-10-CM | POA: Diagnosis present

## 2023-09-19 DIAGNOSIS — Z823 Family history of stroke: Secondary | ICD-10-CM

## 2023-09-19 DIAGNOSIS — R413 Other amnesia: Secondary | ICD-10-CM | POA: Diagnosis present

## 2023-09-19 DIAGNOSIS — Z801 Family history of malignant neoplasm of trachea, bronchus and lung: Secondary | ICD-10-CM

## 2023-09-19 DIAGNOSIS — Z8673 Personal history of transient ischemic attack (TIA), and cerebral infarction without residual deficits: Secondary | ICD-10-CM

## 2023-09-19 DIAGNOSIS — F32A Depression, unspecified: Secondary | ICD-10-CM | POA: Diagnosis present

## 2023-09-19 DIAGNOSIS — Z9889 Other specified postprocedural states: Secondary | ICD-10-CM

## 2023-09-19 DIAGNOSIS — I10 Essential (primary) hypertension: Secondary | ICD-10-CM | POA: Diagnosis present

## 2023-09-19 DIAGNOSIS — I340 Nonrheumatic mitral (valve) insufficiency: Secondary | ICD-10-CM | POA: Diagnosis present

## 2023-09-19 DIAGNOSIS — M25512 Pain in left shoulder: Secondary | ICD-10-CM | POA: Diagnosis present

## 2023-09-19 DIAGNOSIS — Z955 Presence of coronary angioplasty implant and graft: Secondary | ICD-10-CM

## 2023-09-19 DIAGNOSIS — E039 Hypothyroidism, unspecified: Secondary | ICD-10-CM | POA: Diagnosis present

## 2023-09-19 DIAGNOSIS — Z9841 Cataract extraction status, right eye: Secondary | ICD-10-CM

## 2023-09-19 DIAGNOSIS — I471 Supraventricular tachycardia, unspecified: Secondary | ICD-10-CM | POA: Diagnosis present

## 2023-09-19 DIAGNOSIS — Z87891 Personal history of nicotine dependence: Secondary | ICD-10-CM

## 2023-09-19 DIAGNOSIS — Z79899 Other long term (current) drug therapy: Secondary | ICD-10-CM

## 2023-09-19 DIAGNOSIS — Z8546 Personal history of malignant neoplasm of prostate: Secondary | ICD-10-CM

## 2023-09-19 DIAGNOSIS — Z634 Disappearance and death of family member: Secondary | ICD-10-CM

## 2023-09-19 DIAGNOSIS — I2510 Atherosclerotic heart disease of native coronary artery without angina pectoris: Secondary | ICD-10-CM | POA: Diagnosis present

## 2023-09-19 DIAGNOSIS — Z96611 Presence of right artificial shoulder joint: Secondary | ICD-10-CM | POA: Diagnosis present

## 2023-09-19 DIAGNOSIS — Z79890 Hormone replacement therapy (postmenopausal): Secondary | ICD-10-CM

## 2023-09-19 LAB — CBC WITH DIFFERENTIAL/PLATELET
Abs Immature Granulocytes: 0.02 10*3/uL (ref 0.00–0.07)
Basophils Absolute: 0.1 10*3/uL (ref 0.0–0.1)
Basophils Relative: 1 %
Eosinophils Absolute: 0.1 10*3/uL (ref 0.0–0.5)
Eosinophils Relative: 1 %
HCT: 49.8 % (ref 39.0–52.0)
Hemoglobin: 15.7 g/dL (ref 13.0–17.0)
Immature Granulocytes: 0 %
Lymphocytes Relative: 36 %
Lymphs Abs: 2.7 10*3/uL (ref 0.7–4.0)
MCH: 29.5 pg (ref 26.0–34.0)
MCHC: 31.5 g/dL (ref 30.0–36.0)
MCV: 93.6 fL (ref 80.0–100.0)
Monocytes Absolute: 0.7 10*3/uL (ref 0.1–1.0)
Monocytes Relative: 9 %
Neutro Abs: 4 10*3/uL (ref 1.7–7.7)
Neutrophils Relative %: 53 %
Platelets: 267 10*3/uL (ref 150–400)
RBC: 5.32 MIL/uL (ref 4.22–5.81)
RDW: 12.8 % (ref 11.5–15.5)
WBC: 7.6 10*3/uL (ref 4.0–10.5)
nRBC: 0 % (ref 0.0–0.2)

## 2023-09-19 LAB — URINALYSIS, ROUTINE W REFLEX MICROSCOPIC
Bilirubin Urine: NEGATIVE
Glucose, UA: NEGATIVE mg/dL
Hgb urine dipstick: NEGATIVE
Ketones, ur: NEGATIVE mg/dL
Leukocytes,Ua: NEGATIVE
Nitrite: NEGATIVE
Protein, ur: NEGATIVE mg/dL
Specific Gravity, Urine: 1.006 (ref 1.005–1.030)
pH: 6 (ref 5.0–8.0)

## 2023-09-19 LAB — COMPREHENSIVE METABOLIC PANEL
ALT: 32 U/L (ref 0–44)
AST: 23 U/L (ref 15–41)
Albumin: 3.4 g/dL — ABNORMAL LOW (ref 3.5–5.0)
Alkaline Phosphatase: 104 U/L (ref 38–126)
Anion gap: 8 (ref 5–15)
BUN: 14 mg/dL (ref 8–23)
CO2: 26 mmol/L (ref 22–32)
Calcium: 8.9 mg/dL (ref 8.9–10.3)
Chloride: 106 mmol/L (ref 98–111)
Creatinine, Ser: 1.07 mg/dL (ref 0.61–1.24)
GFR, Estimated: >60 mL/min (ref >60)
Glucose, Bld: 104 mg/dL — ABNORMAL HIGH (ref 70–99)
Potassium: 3.9 mmol/L (ref 3.5–5.1)
Sodium: 140 mmol/L (ref 135–145)
Total Bilirubin: 0.6 mg/dL (ref 0.3–1.2)
Total Protein: 6.8 g/dL (ref 6.5–8.1)

## 2023-09-19 LAB — CBG MONITORING, ED: Glucose-Capillary: 126 mg/dL — ABNORMAL HIGH (ref 70–99)

## 2023-09-19 MED ORDER — MIRTAZAPINE 15 MG PO TABS
45.0000 mg | ORAL_TABLET | Freq: Every day | ORAL | Status: DC
  Administered 2023-09-19 – 2023-09-20 (×2): 45 mg via ORAL
  Filled 2023-09-19 (×2): qty 3

## 2023-09-19 MED ORDER — BUPROPION HCL ER (XL) 150 MG PO TB24: 150.0000 mg | ORAL_TABLET | Freq: Three times a day (TID) | ORAL | Status: DC

## 2023-09-19 MED ORDER — PANTOPRAZOLE SODIUM 40 MG PO TBEC
40.0000 mg | DELAYED_RELEASE_TABLET | Freq: Every day | ORAL | Status: DC
  Administered 2023-09-20 – 2023-09-21 (×2): 40 mg via ORAL
  Filled 2023-09-19 (×2): qty 1

## 2023-09-19 MED ORDER — BUPROPION HCL ER (XL) 150 MG PO TB24
300.0000 mg | ORAL_TABLET | Freq: Every day | ORAL | Status: DC
  Administered 2023-09-19 – 2023-09-21 (×3): 300 mg via ORAL
  Filled 2023-09-19 (×3): qty 2

## 2023-09-19 MED ORDER — THYROID 60 MG PO TABS
60.0000 mg | ORAL_TABLET | Freq: Every day | ORAL | Status: DC
  Administered 2023-09-20: 60 mg via ORAL
  Filled 2023-09-19: qty 1

## 2023-09-19 MED ORDER — VITAMIN D 25 MCG (1000 UNIT) PO TABS
5000.0000 [IU] | ORAL_TABLET | Freq: Every day | ORAL | Status: DC
  Administered 2023-09-19 – 2023-09-21 (×3): 5000 [IU] via ORAL
  Filled 2023-09-19 (×3): qty 5

## 2023-09-19 MED ORDER — LEVOTHYROXINE SODIUM 75 MCG PO TABS
75.0000 ug | ORAL_TABLET | Freq: Every day | ORAL | Status: DC
  Administered 2023-09-20 – 2023-09-21 (×2): 75 ug via ORAL
  Filled 2023-09-19 (×3): qty 1

## 2023-09-19 MED ORDER — ALPRAZOLAM 0.25 MG PO TABS
0.2500 mg | ORAL_TABLET | Freq: Every evening | ORAL | Status: DC | PRN
  Administered 2023-09-19: 0.25 mg via ORAL
  Filled 2023-09-19: qty 1

## 2023-09-19 MED ORDER — ESOMEPRAZOLE MAGNESIUM 20 MG PO PACK: 20.0000 mg | PACK | Freq: Every day | ORAL | Status: DC

## 2023-09-19 MED ORDER — SODIUM CHLORIDE 0.9 % IV BOLUS
500.0000 mL | Freq: Once | INTRAVENOUS | Status: AC
  Administered 2023-09-19: 500 mL via INTRAVENOUS

## 2023-09-19 MED ORDER — CARBIDOPA-LEVODOPA ER 25-100 MG PO TBCR
1.0000 | EXTENDED_RELEASE_TABLET | Freq: Three times a day (TID) | ORAL | Status: DC
  Administered 2023-09-19 – 2023-09-21 (×6): 1 via ORAL
  Filled 2023-09-19 (×8): qty 1

## 2023-09-19 NOTE — ED Triage Notes (Signed)
Pt accompanied by daughter who reports multiple falls over the past 2 weeks, last fall was about 2 days ago. Pt had hx of parkinsons and has increased tremors lately. Pt lives at an Independent living facility. Pt denies any injuries from his falls.

## 2023-09-19 NOTE — ED Provider Notes (Signed)
[place] EMERGENCY DEPARTMENT AT [facility] Provider Note   CSN: [id number] Arrival date & time: 09/19/23  1336     History  Chief Complaint  Patient presents with   Fall   Tremors    Staci Eichenauer is a 82 y.o. male.  Pt is a 82 yo male with pmhx significant for Parkinson's disease, prostate cancer, cad, and hypothyroidism.  Pt lives in independent living and has been falling frequently.  He denies injury with his falls because he can tell when he's going to fall and "picks his landing place."  Pt said he feels weak.         Home Medications Prior to Admission medications   Medication Sig Start Date End Date Taking? Authorizing Provider  ALPRAZolam Prudy Feeler) 1 MG tablet Take 1 mg by mouth daily as needed. 08/14/22   [provider]  amLODipine (NORVASC) 2.5 MG tablet Take 2.5 mg by mouth daily.    [provider]  buPROPion (WELLBUTRIN XL) 150 MG 24 hr tablet Take 150 mg by mouth 3 (three) times daily.    [provider]  Carbidopa-Levodopa ER (SINEMET CR) 25-100 MG tablet controlled release TAKE 1 TABLET BY MOUTH AT 7 IN THE MORNING THEN TAKE 1 TABLET BY MOUTH AT NOON, THEN TAKE 1 TABLET BY MOUTH AT 5 IN THE EVENING 09/17/23   [name], MD  Cholecalciferol (DIALYVITE VITAMIN D 5000) 125 MCG (5000 UT) capsule Take 5,000 Units by mouth daily.    [provider]  clopidogrel (PLAVIX) 75 MG tablet Take 75 mg by mouth daily.    [provider]  dicyclomine (BENTYL) 10 MG capsule TAKE 1 CAPSULE BY MOUTH EVERY MORNING 06/29/23   [name], NP  esomeprazole (NEXIUM) 20 MG packet Take 20 mg by mouth daily before breakfast.    [provider]  levothyroxine (SYNTHROID) 75 MCG tablet TAKE 1 TABLET BY MOUTH DAILY BEFORE BREAKFAST 08/10/23   [name], MD  mirtazapine (REMERON) 45 MG tablet Take 45 mg by mouth at bedtime. 04/01/22   [provider]  nitroGLYCERIN (NITROSTAT) 0.4 MG SL tablet Place 1 tablet (0.4  mg total) under the tongue every 5 (five) minutes as needed for chest pain. 08/25/22 08/26/23  [name], [name], MD  thyroid (ARMOUR) 60 MG tablet Take 60 mg by mouth daily before breakfast.    [provider]      Allergies    Patient has no known allergies.    Review of Systems   Review of Systems  Neurological:  Positive for tremors and weakness.  All other systems reviewed and are negative.   Physical Exam Updated Vital Signs BP 133/64   Pulse 69   Temp 97.7 F (36.5 C) (Oral)   Resp 17   Ht 5\' 11"  (1.803 m)   Wt 74.4 kg   SpO2 99%   BMI 22.87 kg/m  Physical Exam Vitals and nursing note reviewed.  Constitutional:      Appearance: Normal appearance.  HENT:     Head: Normocephalic and atraumatic.     Right Ear: External ear normal.     Left Ear: External ear normal.     Nose: Nose normal.     Mouth/Throat:     Mouth: Mucous membranes are moist.     Pharynx: Oropharynx is clear.  Eyes:     Extraocular Movements: Extraocular movements intact.     Conjunctiva/sclera: Conjunctivae normal.     Pupils: Pupils are equal, round, and  reactive to light.  Cardiovascular:     Rate and Rhythm: Normal rate and regular rhythm.     Pulses: Normal pulses.     Heart sounds: Normal heart sounds.  Pulmonary:     Effort: Pulmonary effort is normal.     Breath sounds: Normal breath sounds.  Abdominal:     General: Abdomen is flat. Bowel sounds are normal.     Palpations: Abdomen is soft.  Musculoskeletal:        General: Normal range of motion.     Cervical back: Normal range of motion and neck supple.  Skin:    General: Skin is warm.     Capillary Refill: Capillary refill takes less than 2 seconds.  Neurological:     General: No focal deficit present.     Mental Status: He is alert and oriented to person, place, and time.     Motor: Tremor present.  Psychiatric:        Mood and Affect: Mood normal.        Behavior: Behavior normal.        Thought Content:  Thought content normal.     ED Results / Procedures / Treatments   Labs (all labs ordered are listed, but only abnormal results are displayed) Labs Reviewed  CBG MONITORING, ED - Abnormal; Notable for the following components:      Result Value   Glucose-Capillary 126 (*)    All other components within normal limits  CBC WITH DIFFERENTIAL/PLATELET  URINALYSIS, ROUTINE W REFLEX MICROSCOPIC  COMPREHENSIVE METABOLIC PANEL    EKG None  Radiology DG Chest 2 View  Result Date: 09/19/2023 CLINICAL DATA:  Altered mental status, shortness of breath EXAM: CHEST - 2 VIEW COMPARISON:  11/17/2021 FINDINGS: The heart size and mediastinal contours are within normal limits. Both lungs are clear. Status post right shoulder reverse arthroplasty. IMPRESSION: No acute abnormality of the lungs. Electronically Signed   By: Jearld Lesch M.D.   On: 09/19/2023 15:33    Procedures Procedures    Medications Ordered in ED Medications  sodium chloride 0.9 % bolus 500 mL (0 mLs Intravenous Stopped 09/19/23 1518)    ED Course/ Medical Decision Making/ A&P                                 Medical Decision Making Amount and/or Complexity of Data Reviewed Labs: ordered. Radiology: ordered.   This patient presents to the ED for concern of ams, this involves an extensive number of treatment options, and is a complaint that carries with it a high risk of complications and morbidity.  The differential diagnosis includes electrolyte abn, infection, head injury   Co morbidities that complicate the patient evaluation  arkinson's disease, prostate cancer, cad, and hypothyroidism   Additional history obtained:  Additional history obtained from epic chart review External records from outside source obtained and reviewed including daughter   Lab Tests:  I Ordered, and personally interpreted labs.  The pertinent results include:  cbc nl   Imaging Studies ordered:  I ordered imaging studies  including ct head, cxr  Scans pending at shift change   Cardiac Monitoring:  The patient was maintained on a cardiac monitor.  I personally viewed and interpreted the cardiac monitored which showed an underlying rhythm of: nsr   Medicines ordered and prescription drug management:  I ordered medication including ivfs  for ams  Reevaluation of the patient after these  medicines showed that the patient improved I have reviewed the patients home medicines and have made adjustments as needed   Test Considered:  ct  Problem List / ED Course:  AMS:  labs/ct pending   Reevaluation:  After the interventions noted above, I reevaluated the patient and found that they have :improved   Social Determinants of Health:  Lives in independent living   Dispostion:  Pending at shift change        Final Clinical Impression(s) / ED Diagnoses Final diagnoses:  Fall, initial encounter    Rx / DC Orders ED Discharge Orders     None         Jacalyn Lefevre, MD 09/19/23 1540

## 2023-09-19 NOTE — Telephone Encounter (Signed)
I received an after-hours call message from patient's daughter, Gennie Alma.  I was able to connect with her.  She lives in [place] and she reported that she checked on him this weekend.  He admitted that he had fallen a few times.  He seems to be unsteady.  He has decided to give up driving.  Falls were unwitnessed.  He does not live with anybody.  She was advised to take him to the nearest hospital, they will likely go to [facility].  I explained to her that increase in falls can have many reasons including electrolyte disturbance, dehydration, urinary tract infection and since he had fallen without knowing exactly what type of injuries he has sustained, I would recommend ER visit, they would likely also do a CT scan of the head.  She demonstrated understanding and agreement.  I also talked to her about long-term living situation and getting him more supervision as well.  She is encouraged to talk to him about getting some help during the day, maybe a few hours a day, someone to come in and help with activities of daily living, fixing meals, getting ready etc.  He currently lives in independent living at [place].  I would recommend considering a transition to assisted living.  She understood and will talk to him.

## 2023-09-19 NOTE — H&P (Signed)
HPI  Robert Horne WUJ:[id number] DOB: 06-Aug-1941 DOA: 09/19/2023  PCP: [name], MD   Chief Complaint: recurrent falls  HPI:  82 year old male ILF resident since 2022 CAD status post PCI 2003 with multiple caths in the past-stress test [name] 08/2022 normal perfusion study EF 65% low risk TIA 2017 Prior prostate cancer Right shoulder arthroplasty 2014 Atrial fibrillation since 10/2021 admission CHADVASC >5--- he is not on anticoagulation after discussion with his cardiologist 07/23/2023 office visit OSA intolerant of CPAP Hypothyroidism Depression Parkinsonism on levodopa since 09/2022 followed by [name] neurology associated with some mild cognitive decline  He was seen about a week ago by cardiology [name], had an echo done which was essentially normal  He was increased recently on his Wellbutrin from 300-4 50 daily in divided doses of 150 3 times daily because of his depression from the loss of his wife back in 2023-still becomes emotional secondary to discussion of this--this is the only change in medication that he has had Tells me that he has had some falls predominantly in the past week-most of them were slow assisted falls to the ground he does not recall hitting his head He is on Plavix for TIA in 2017 and has been told in the past that maybe it is time to come off of it He states that he is also had episodes while driving where he "loses it" and blacks out for several seconds In the emergency room I am looking at his monitors his heart rate drops into the high 40s but quickly rebounds to the 50 range He does not classically describe any orthostatic signs but has always in the past several years "gotten up slow"   Review of Systems:  Denies chest pain fever chills nausea vomiting dark stool tarry stool blurred vision double vision rash lower extremity edema unilateral weakness  ED Course: Bolused fluids, labs obtained imaging obtained   Past  Medical History:  Diagnosis Date   Cancer [facility])    prostate, 2002   H/O heart artery stent    Hypothyroidism    Past Surgical History:  Procedure Laterality Date   BACK SURGERY     CARDIAC CATHETERIZATION     CATARACT EXTRACTION Bilateral    PROSTATE SURGERY     REVERSE SHOULDER ARTHROPLASTY Right 11/06/2021   Procedure: REVERSE SHOULDER ARTHROPLASTY;  Surgeon: [name], MD;  Location: [facility] ORS;  Service: Orthopedics;  Laterality: Right;   TONSILLECTOMY      reports that he quit smoking about 52 years ago. His smoking use included cigarettes. He started smoking about 60 years ago. He has a 8 pack-year smoking history. He has never used smokeless tobacco. He reports current alcohol use of about 2.0 standard drinks of alcohol per week. He reports that he does not use drugs.  Mobility: Independent goes for walk   No Known Allergies Family History  Problem Relation Age of Onset   Stroke Sister 81   Lung cancer Sister    Colon cancer Neg Hx    Stomach cancer Neg Hx    Esophageal cancer Neg Hx    Parkinson's disease Neg Hx    Prior to Admission medications   Medication Sig Start Date End Date Taking? Authorizing Provider  ALPRAZolam Prudy Feeler) 1 MG tablet Take 1 mg by mouth daily as needed. 08/14/22   [provider]  amLODipine (NORVASC) 2.5 MG tablet Take 2.5 mg by mouth daily.    [provider]  buPROPion (WELLBUTRIN XL)  150 MG 24 hr tablet Take 150 mg by mouth 3 (three) times daily.    [provider]  Carbidopa-Levodopa ER (SINEMET CR) 25-100 MG tablet controlled release TAKE 1 TABLET BY MOUTH AT 7 IN THE MORNING THEN TAKE 1 TABLET BY MOUTH AT NOON, THEN TAKE 1 TABLET BY MOUTH AT 5 IN THE EVENING 09/17/23   [name], MD  Cholecalciferol (DIALYVITE VITAMIN D 5000) 125 MCG (5000 UT) capsule Take 5,000 Units by mouth daily.    [provider]  clopidogrel (PLAVIX) 75 MG tablet Take 75 mg by mouth daily.    [provider]   dicyclomine (BENTYL) 10 MG capsule TAKE 1 CAPSULE BY MOUTH EVERY MORNING 06/29/23   [name], NP  esomeprazole (NEXIUM) 20 MG packet Take 20 mg by mouth daily before breakfast.    [provider]  levothyroxine (SYNTHROID) 75 MCG tablet TAKE 1 TABLET BY MOUTH DAILY BEFORE BREAKFAST 08/10/23   [name], MD  mirtazapine (REMERON) 45 MG tablet Take 45 mg by mouth at bedtime. 04/01/22   [provider]  nitroGLYCERIN (NITROSTAT) 0.4 MG SL tablet Place 1 tablet (0.4 mg total) under the tongue every 5 (five) minutes as needed for chest pain. 08/25/22 08/26/23  [name], [name], MD  thyroid (ARMOUR) 60 MG tablet Take 60 mg by mouth daily before breakfast.    [provider]    Physical Exam:  Vitals:   09/19/23 1515 09/19/23 1600  BP: 133/64 113/67  Pulse: 69 (!) 43  Resp: 17 13  Temp:    SpO2: 99% 99%    Awake coherent pleasant no distress pupils equally reactive no icterus no pallor tongue protrudes midline moderate dentition shoulder shrug symmetric S1-S2 seems to be in sinus with some sinus bradycardia Chest is clear no wheeze rales rhonchi Finger-nose-finger intact Power 5/5 upper lower extremity major muscle groups in torso and more distal groups Plantars are downgoing dorsi plantarflexion is intact bilaterally No lower extremity edema Gait not assessed  I have personally reviewed following labs and imaging studies  Labs:  Sodium 140 BUN/creatinine 14/1.0 LFTs normal WBC 7.6 hemoglobin 15 platelet 267 CT head = subdural collection overlying left cerebral convexity?  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 no midline shift  Medical tests:  EKG independently reviewed: Yes independently interpreted seems to be sinus rhythm-PR slightly  prolonged at 0.24 QRS axis is about -40 I do not appreciate any ST-T wave changes  Test discussed with performing physician: Yes  Decision to obtain old records:  Yes  Review and summation of old records:  Yes  Principal Problem:   Acute subdural hematoma (HCC)   Assessment/Plan Frequent falls Etiology unclear will get orthostatics discontinue amlodipine 2.5 which is home medication-keep on telemetry overnight if he has any pauses or dips in his heart rate I will ask cardiology to see-he has a history of paroxysmal A-fib but is not on anticoagulation because of currently being on Plavix presumably per Dr. Verl Dicker note It would be a good idea to get an outpatient event/Holter monitor for him as he may have some element of tachybradycardia syndrome   Subacute subdural left-sided Repeat CT scan a.m.-defer to neurosurgery planning-holding Plavix at this time  TIA 2017 Secondary prevention measures no Plavix for now  CAD PCI 2003 multiple caths  in the past normal echo recently Not on GDMT--requires careful consideration for antiplatelet agents  Atrial fibrillation since 10/2021 CHADVASC >5 No anticoagulation at this time seems to have more bradycardia predominant late-not on rate control  Hypothyroidism Is on both Armour Thyroid and Synthroid He will need to follow-up with the prescriber who prescribed both of these-it would be beneficial for him to be on just 1 medication such as Synthroid 75 which she is on and have a TSH in about a week  Parkinson's on levodopa since 09/2022 follows with [facility] neurology He takes specialized dosing of carbidopa 7 AM 12 noon and 5 PM and I have asked pharmacy to reconcile the same for him  Emotional lability/prolonged grieving response secondary to loss of wife 2023 Recently increased Wellbutrin from 300 daily to 450 [employer] there is a 21% incidence of tachyarrhythmia, 5% incidence or so dizziness so I would prefer to scaling back  to 300 daily and he can obtain instead some counseling to talk through these things I do not think the benefit of the higher dose outweighs the risk He can use low-dose Remeron 45 at bedtime for now but probably will scale that back as well He uses Xanax sparingly once a week to help him sleep and have cut that back to 0.25 as needed for sleep   Severity of Illness: The appropriate patient status for this patient is INPATIENT. Inpatient status is judged to be reasonable and necessary in order to provide the required intensity of service to ensure the patient's safety. The patient's presenting symptoms, physical exam findings, and initial radiographic and laboratory data in the context of their chronic comorbidities is felt to place them at high risk for further clinical deterioration. Furthermore, it is not anticipated that the patient will be medically stable for discharge from the hospital within 2 midnights of admission.   * I certify that at the point of admission it is my clinical judgment that the patient will require inpatient hospital care spanning beyond 2 midnights from the point of admission due to high intensity of service, high risk for further deterioration and high frequency of surveillance required.*   DVT prophylaxis: SCD Code Status: Full Family Communication: Discussed with daughter at the bedside Consults called: None as yet may need to speak to cardiology  Time spent: 60 minutes  Mahala Menghini, MD Cordelia Poche my NP partners at night for Care related issues] [facility] --[employer] OR , [email or web address]; password [facility]  09/19/2023, 5:20 PM

## 2023-09-19 NOTE — Progress Notes (Signed)
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 Recommend holding plavix and repeating head CT in morning. Medicine admission for therapy and observation. There is no role for surgical intervention given his age and medical history.formal consult to follow

## 2023-09-19 NOTE — ED Provider Notes (Signed)
  Physical Exam  BP 113/67   Pulse (!) 43   Temp 97.7 F (36.5 C) (Oral)   Resp 13   Ht 5\' 11"  (1.803 m)   Wt 74.4 kg   SpO2 99%   BMI 22.87 kg/m   Physical Exam  Procedures  .Critical Care  Performed by: Lonell Grandchild, MD Authorized by: Lonell Grandchild, MD   Critical care provider statement:    Critical care time (minutes):  30   Critical care was necessary to treat or prevent imminent or life-threatening deterioration of the following conditions:  CNS failure or compromise   Critical care was time spent personally by me on the following activities:  Development of treatment plan with patient or surrogate, discussions with consultants, evaluation of patient's response to treatment, examination of patient, ordering and review of laboratory studies, ordering and review of radiographic studies, ordering and performing treatments and interventions, pulse oximetry, re-evaluation of patient's condition and review of old charts   Care discussed with: admitting provider     ED Course / MDM   Clinical Course as of 09/19/23 1649  Sat Sep 19, 2023  1540 Received sign out from Dr. Particia Nearing pending CBC, CT head. Hx of parkinsons, recent falls.  [WS]  1647 CT scan of the head does show a subdural with mild mass effect.  Radiology reports that this is either chronic or subacute.  Patient is on Plavix.  I discussed this with Cala Bradford the NP working with Dr. Yetta Barre of neurosurgery.  She recommends that we keep patient for observation for repeat head CT and they will formally consult on the patient.  Recommend admission to medicine service.  Discussed results with the family, they are in understanding.  Patient reports he feels well currently.  He is awake and oriented.  Discussed with the hospitalist, they will admit the patient for observation. [WS]    Clinical Course User Index [WS] Lonell Grandchild, MD   Medical Decision Making Amount and/or Complexity of Data Reviewed Labs:  ordered. Radiology: ordered.  Risk Decision regarding hospitalization.          Lonell Grandchild, MD 09/19/23 [phone]

## 2023-09-20 ENCOUNTER — Other Ambulatory Visit: Payer: Self-pay | Admitting: Home Health

## 2023-09-20 ENCOUNTER — Inpatient Hospital Stay (HOSPITAL_COMMUNITY): Payer: Medicare Other

## 2023-09-20 DIAGNOSIS — W19XXXA Unspecified fall, initial encounter: Secondary | ICD-10-CM | POA: Diagnosis not present

## 2023-09-20 DIAGNOSIS — R001 Bradycardia, unspecified: Secondary | ICD-10-CM

## 2023-09-20 DIAGNOSIS — R42 Dizziness and giddiness: Secondary | ICD-10-CM | POA: Diagnosis not present

## 2023-09-20 DIAGNOSIS — S065XAA Traumatic subdural hemorrhage with loss of consciousness status unknown, initial encounter: Secondary | ICD-10-CM | POA: Diagnosis not present

## 2023-09-20 DIAGNOSIS — I440 Atrioventricular block, first degree: Secondary | ICD-10-CM

## 2023-09-20 DIAGNOSIS — R55 Syncope and collapse: Secondary | ICD-10-CM

## 2023-09-20 LAB — COMPREHENSIVE METABOLIC PANEL
ALT: 37 U/L (ref 0–44)
AST: 30 U/L (ref 15–41)
Albumin: 3.4 g/dL — ABNORMAL LOW (ref 3.5–5.0)
Alkaline Phosphatase: 99 U/L (ref 38–126)
Anion gap: 9 (ref 5–15)
BUN: 17 mg/dL (ref 8–23)
CO2: 23 mmol/L (ref 22–32)
Calcium: 8.8 mg/dL — ABNORMAL LOW (ref 8.9–10.3)
Chloride: 108 mmol/L (ref 98–111)
Creatinine, Ser: 0.93 mg/dL (ref 0.61–1.24)
GFR, Estimated: >60 mL/min (ref >60)
Glucose, Bld: 94 mg/dL (ref 70–99)
Potassium: 4.3 mmol/L (ref 3.5–5.1)
Sodium: 140 mmol/L (ref 135–145)
Total Bilirubin: 0.5 mg/dL (ref 0.3–1.2)
Total Protein: 6.7 g/dL (ref 6.5–8.1)

## 2023-09-20 LAB — CBC
HCT: 45.1 % (ref 39.0–52.0)
Hemoglobin: 14.7 g/dL (ref 13.0–17.0)
MCH: 30.6 pg (ref 26.0–34.0)
MCHC: 32.6 g/dL (ref 30.0–36.0)
MCV: 93.8 fL (ref 80.0–100.0)
Platelets: 288 10*3/uL (ref 150–400)
RBC: 4.81 MIL/uL (ref 4.22–5.81)
RDW: 13 % (ref 11.5–15.5)
WBC: 8 10*3/uL (ref 4.0–10.5)
nRBC: 0 % (ref 0.0–0.2)

## 2023-09-20 LAB — PROTIME-INR
INR: 1 (ref 0.8–1.2)
Prothrombin Time: 13.5 s (ref 11.4–15.2)

## 2023-09-20 MED ORDER — ORAL CARE MOUTH RINSE: 15.0000 mL | OROMUCOSAL | Status: DC | PRN

## 2023-09-20 NOTE — Progress Notes (Signed)
HOSPITALIST ROUNDING NOTE Robert Horne ZOX:[id number]  DOB: 1941/01/08  DOA: 09/19/2023  PCP: [name], MD  09/20/2023,7:21 AM   LOS: 1 day      Code Status: full  From:  home    Current Dispo: unclear     82 year old male ILF [place] resident since 2022 CAD status post PCI 2003 with multiple caths in the past-stress test [name] 08/2022 normal perfusion study EF 65% low risk TIA 2017 Prior prostate cancer Right shoulder arthroplasty 2014 Atrial fibrillation since 10/2021 admission CHADVASC >5--- he is not on anticoagulation after discussion with his cardiologist 07/23/2023 office visit OSA intolerant of CPAP Hypothyroidism Depression Parkinsonism on levodopa since 09/2022 followed by [name] neurology associated with some mild cognitive decline He was seen about a week ago by cardiology 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 falls resulting in trauma Cardiology consult-has appropriate chronotropy to stimulation goes to the 60s-discontinue amlodipine-outpatient ZIO monitor  Subdural hemorrhage on CT scan on admission Not a candidate for Plavix-defer to neurosurgery when to resume--- therapy to eval given recent falls  A-fib CHADVASC >5, Hasbled>4 Not a candidate for anticoagulation--no rate control at this time but does need monitoring  CAD 2003-see above discussions  Hypothyroid Discontinue Armour Thyroid while in hospital continue Synthroid alone repeat TSH in 3 weeks Do not repeat TSH in hospital as it will be inaccurate steady-state metabolism-I canceled the order  OSA intolerant to CPAP  Parkinsonism Keep on usual scheduled meds Sinemet 3 times daily  Pathological grief response Cut back Wellbutrin from 450--->300 which was prior dose, continue Remeron 45, Xanax 0.25 sleep   DVT prophylaxis: scd  Status is: Inpatient Remains inpatient appropriate because:     Subjective:  Awake coherent no headache No cp no fever no n v No focal deficit  Objective + exam Vitals:   09/20/23 0045 09/20/23 0300 09/20/23 0442 09/20/23 0600  BP:  129/82  126/71  Pulse:  74  64  Resp:  18  14  Temp: 98 F (36.7 C)  98.2 F (36.8 C)   TempSrc: Oral  Oral   SpO2:  100%  99%  Weight:      Height:       Filed Weights   09/19/23 1348  Weight: 74.4 kg    Examination:  Awake coherent smile symmetric power 5/5 finger-nose-finger intact Chest clear S1-S2 seems to be sinus Abdomen soft no rebound No lower extremity edema  Data Reviewed: reviewed   CBC    Component Value Date/Time   WBC 7.6 09/19/2023 1402   RBC 5.32 09/19/2023 1402   HGB 15.7 09/19/2023 1402   HGB 15.9 10/07/2022 1500   HCT 49.8 09/19/2023 1402   HCT 47.1 10/07/2022 1500   PLT 267 09/19/2023 1402   PLT 310 10/07/2022 1500   MCV 93.6 09/19/2023 1402   MCV 90 10/07/2022 1500   MCH 29.5 09/19/2023 1402   MCHC 31.5 09/19/2023 1402   RDW 12.8 09/19/2023 1402   RDW 12.6 10/07/2022 1500   LYMPHSABS 2.7 09/19/2023 1402   MONOABS 0.7 09/19/2023 1402   EOSABS 0.1 09/19/2023 1402   BASOSABS 0.1 09/19/2023 1402      Latest Ref Rng & Units 09/20/2023    3:20 AM 09/19/2023    3:33 PM 02/03/2023    4:06 PM  CMP  Glucose 70 - 99 mg/dL 94  811  914   BUN 8 - 23 mg/dL  17  14  13    Creatinine 0.61 - 1.24 mg/dL 4.09  8.11  9.14   Sodium 135 - 145 mmol/L 140  140  138   Potassium 3.5 - 5.1 mmol/L 4.3  3.9  4.2   Chloride 98 - 111 mmol/L 108  106  103   CO2 22 - 32 mmol/L 23  26  23    Calcium 8.9 - 10.3 mg/dL 8.8  8.9  8.9   Total Protein 6.5 - 8.1 g/dL 6.7  6.8  6.7   Total Bilirubin 0.3 - 1.2 mg/dL 0.5  0.6  0.3   Alkaline Phos 38 - 126 U/L 99  104  159   AST 15 - 41 U/L 30  23  21    ALT 0 - 44 U/L 37  32  9      Scheduled Meds:  buPROPion  300 mg Oral Daily   Carbidopa-Levodopa ER  1 tablet Oral TID   cholecalciferol  5,000 Units Oral Daily   levothyroxine  75 mcg Oral  Q0600   mirtazapine  45 mg Oral QHS   pantoprazole  40 mg Oral Daily   thyroid  60 mg Oral QAC breakfast   Continuous Infusions:  Time 45  Rhetta Mura, MD  [facility]

## 2023-09-20 NOTE — Consult Note (Signed)
Reason for Consult:sdh Referring Physician: EDP  Robert Horne is an 82 y.o. male.   HPI:  82 year old male presented to the ED after several falls over the last couple months. Has  a history of parkinsons. He is on plavix for afib. He states he took a xanax the other night because he was having involuntary movement in his jaw and pain in his left shoulder that radiated to his right. Denies any headaches, NV dizziness or vision changes. He is sitting up in the chair eating a biscuit during examination.  Past Medical History:  Diagnosis Date   Cancer [facility])    prostate, 2002   H/O heart artery stent    Hypothyroidism     Past Surgical History:  Procedure Laterality Date   BACK SURGERY     CARDIAC CATHETERIZATION     CATARACT EXTRACTION Bilateral    PROSTATE SURGERY     REVERSE SHOULDER ARTHROPLASTY Right 11/06/2021   Procedure: REVERSE SHOULDER ARTHROPLASTY;  Surgeon: [name], MD;  Location: [facility];  Service: Orthopedics;  Laterality: Right;   TONSILLECTOMY      No Known Allergies  Social History   Tobacco Use   Smoking status: Former    Current packs/day: 0.00    Average packs/day: 1 pack/day for 8.0 years (8.0 ttl pk-yrs)    Types: Cigarettes    Start date: 10/24/1962    Quit date: 10/24/1970    Years since quitting: 52.9   Smokeless tobacco: Never  Substance Use Topics   Alcohol use: Yes    Alcohol/week: 2.0 standard drinks of alcohol    Types: 2 Glasses of wine per week    Comment: weekly    Family History  Problem Relation Age of Onset   Stroke Sister 34   Lung cancer Sister    Colon cancer Neg Hx    Stomach cancer Neg Hx    Esophageal cancer Neg Hx    Parkinson's disease Neg Hx      Review of Systems  Positive ROS: as above  All other systems have been reviewed and were otherwise negative with the exception of those mentioned in the HPI and as above.  Objective: Vital signs in last 24 hours: Temp:  [97.7 F (36.5 C)-98.2 F (36.8 C)] 98 F (36.7  C) (10/27 0832) Pulse Rate:  [43-82] 82 (10/27 0832) Resp:  [11-18] 14 (10/27 0832) BP: (105-152)/(56-91) 105/71 (10/27 0832) SpO2:  [96 %-100 %] 99 % (10/27 0832) Weight:  [74.4 kg] 74.4 kg (10/26 1348)  General Appearance: Alert, cooperative, no distress, appears stated age Head: Normocephalic, without obvious abnormality, atraumatic Eyes: PERRL, conjunctiva/corneas clear, EOM's intact, fundi benign, both eyes      Lungs: respirations unlabored Heart: Regular rate and rhythm Extremities: Extremities normal, atraumatic, no cyanosis or edema Pulses: 2+ and symmetric all extremities Skin: Skin color, texture, turgor normal, no rashes or lesions  NEUROLOGIC:   Mental status: A&O x4, no aphasia, good attention span, Memory and fund of knowledge Motor Exam - grossly normal, normal tone and bulk Sensory Exam - grossly normal Reflexes: symmetric, no pathologic reflexes, No Hoffman's, No clonus Coordination - grossly normal Gait - grossly normal Balance - grossly normal Cranial Nerves: I: smell Not tested  II: visual acuity  OS: na    OD: na  II: visual fields Full to confrontation  II: pupils Equal, round, reactive to light  III,VII: ptosis None  III,IV,VI: extraocular muscles  Full ROM  V: mastication Normal  V: facial  light touch sensation  Normal  V,VII: corneal reflex  Present  VII: facial muscle function - upper  Normal  VII: facial muscle function - lower Normal  VIII: hearing Not tested  IX: soft palate elevation  Normal  IX,X: gag reflex Present  XI: trapezius strength  5/5  XI: sternocleidomastoid strength 5/5  XI: neck flexion strength  5/5  XII: tongue strength  Normal    Data Review Lab Results  Component Value Date   WBC 7.6 09/19/2023   HGB 15.7 09/19/2023   HCT 49.8 09/19/2023   MCV 93.6 09/19/2023   PLT 267 09/19/2023   Lab Results  Component Value Date   NA 140 09/20/2023   K 4.3 09/20/2023   CL 108 09/20/2023   CO2 23 09/20/2023   BUN 17  09/20/2023   CREATININE 0.93 09/20/2023   GLUCOSE 94 09/20/2023   Lab Results  Component Value Date   INR 1.0 09/20/2023    Radiology: CT HEAD WO CONTRAST  Result Date: 09/20/2023 CLINICAL DATA:  Subdural hematoma follow-up EXAM: CT HEAD WITHOUT CONTRAST TECHNIQUE: Contiguous axial images were obtained from the base of the skull through the vertex without intravenous contrast. RADIATION DOSE REDUCTION: This exam was performed according to the departmental dose-optimization program which includes automated exposure control, adjustment of the mA and/or kV according to patient size and/or use of iterative reconstruction technique. COMPARISON:  CT head 09/19/2023 FINDINGS: Brain: Unchanged 9 mm subdural fluid collection overlying the left cerebral convexity. Mild associated mass effect on the left frontal lobe. No midline shift. Basal cisterns are patent. No hydrocephalus. Age-commensurate cerebral atrophy and chronic small vessel ischemic disease. Vascular: No hyperdense vessel. Intracranial arterial calcification. Skull: No fracture or focal lesion. Sinuses/Orbits: No acute finding. Other: None. IMPRESSION: Unchanged 9 mm low-density subdural fluid collection overlying the left cerebral convexity. Mild associated mass effect on the left frontal lobe. No midline shift. Electronically Signed   By: Minerva Fester M.D.   On: 09/20/2023 00:22   CT Head Wo Contrast  Result Date: 09/19/2023 CLINICAL DATA:  Multiple falls over last 2 weeks, altered mental status. EXAM: CT HEAD WITHOUT CONTRAST TECHNIQUE: Contiguous axial images were obtained from the base of the skull through the vertex without intravenous contrast. RADIATION DOSE REDUCTION: This exam was performed according to the departmental dose-optimization program which includes automated exposure control, adjustment of the mA and/or kV according to patient size and/or use of iterative reconstruction technique. COMPARISON:  None Available. FINDINGS:  Brain: There is a subdural fluid collection overlying the left cerebral convexity which measures up to 9 mm in thickness. This measures above simple fluid attenuation at approximately 23 Hounsfield units. There is mild associated mass effect on the left frontal and parietal lobes. There is no midline shift. The basilar cisterns are patent. There is mild cerebral volume loss with associated ex vacuo dilatation. Periventricular white matter hypoattenuation likely represents chronic small vessel ischemic disease. Vascular: There are vascular calcifications in the carotid siphons. Skull: Normal. Negative for fracture or focal lesion. Sinuses/Orbits: No acute finding. Other: None. 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 No midline shift. These results were called by telephone at the time of interpretation on 09/19/2023 at 3:42 pm to provider Dr. Suezanne Jacquet, who verbally acknowledged these results. Electronically Signed   By: Romona Curls M.D.   On: 09/19/2023 15:46   DG Chest 2 View  Result Date: 09/19/2023 CLINICAL DATA:  Altered mental status, shortness of breath EXAM: CHEST - 2 VIEW COMPARISON:  11/17/2021 FINDINGS: The heart size and mediastinal contours are within normal limits. Both lungs are clear. Status post right shoulder reverse arthroplasty. IMPRESSION: No acute abnormality of the lungs. Electronically Signed   By: Jearld Lesch M.D.   On: 09/19/2023 15:33     Assessment/Plan: 82 year old who has had several falls over the last couple months. CT head shows a very small left sided hygroma it appears. Would not recommend any surgical intervention. We will follow up with him in the office in a couple weeks   Robert Horne 09/20/2023 9:22 AM

## 2023-09-20 NOTE — Consult Note (Addendum)
Cardiology Consultation   Patient ID: Robert Horne MRN: [id number]; DOB: 1941-01-30  Admit date: 09/19/2023 Date of Consult: 09/20/2023  PCP:  Robert Northern, MD   [facility] Providers Cardiologist: Robert. Rosemary Horne   Patient Profile:   Robert Horne is a 82 y.o. male with a hx of CAD with stent to LAD 2003, mitral regurgitation, mitral valve prolapse, paroxysmal A fib (<24 hours during COVID-19 admission 2022, not on DOAC), first degree AVB, SVT, TIA 2017, prostate cancer,  OSA, hypothyroidism, Parkinson's disease, memory loss, depression, who is being seen 09/20/2023 for the evaluation of bradycardia at the request of Robert Horne.  History of Present Illness:   [name] with above past medical history presented to the ER 09/19/2023 night for complaints of frequent falls.  Daughter came in with the patient, reports patient had multiple falls over the past week. He had a worsening tremor lately and currently lives in independent living facility. He states his falls are mostly due to clumsiness and he is having hard time maintain balance. He denied any dizziness with position changes.   He had denied any significant injuries associated to these falls because he is able to predict the falls and re-position himself accordingly to avoid injury.  He had also reported one episode possible "black out" 3 weeks ago while driving to his driveway. He can't elaborate the story clearly because he has no clear recollection of what happened. He does not drive anymore.  He was observed bradycardic with heart rate 40 to 50s at ER last night.  He denied any chest pain, SOB, leg edema. His family felt his memory and confusion are worse lately as well.   Admission diagnostic revealed unremarkable CBC differential, CMP, UA.  Albumin 3.4.  INR 1.  Chest x-ray showed no acute finding.  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  Neurosurgery had recommended holding Plavix and repeat CT scan in 24 hours.  Repeat CT head today revealed unchanged 9 mm low-density subdural fluid collection overlying the left cerebral convexity, mild associated mass effect on the left frontal lobe, no midline shift.  He was seen by neurosurgical service today, felt CT revealing a very small left-sided hygroma, recommending no surgical intervention and outpatient follow-up.  Cardiology is consulted today for further evaluation of bradycardia.    Per chart review, patient follows Robert Robert Horne, has CAD with stent placed to LAD in 2003, most recent cath showing nonobstructive CAD in 2014 [facility]) with 30-40% prox RCA, 40% prox Lcx, 40-50% prox OM2, patent mid LAD stent, 50% ostial D1, 30% prox D2. Modified Bruce exercise Tetrofosmin stress test on 09/04/22 showed low exercise capacity, no ischemic change on stress EKG, normal myocardial perfusion with stress LVEF 65%.   He had paroxysmal A fib <24 hours during hospitalization for COVID-19 10/2021, spontaneously converted to sinus. Echo from 12/26 2022 showed LVEF 60-65%, no RWMA, normal RV, normal PASP, trivial MR, mild late systolic prolapse of both leaflets of the mitral valve. He did have CHA2DS2VASc score of 5, but DOAC was felt not indicated given short duration of A fib. He was on DAPT for TIA at the time. Event monitor for 13 days on 12/13/21 showed predominantly sinus rhythm with first-degree AV block, 12 episodes of SVT, <1% isolated SVE, couplets/triplets, 3 episodes of likely SVT with aberrant conduction, <1% isolated VE, couplets/triplets.  No atrial fibrillation/ atrial  flutter/high-grade AV block, or sinus pause > 3 seconds.  He was last seen by Robert. Rosemary Horne in the office on 07/13/2023, doing well without angina symptoms, remained Plavix monotherapy and Crestor 40 mg daily for CAD and history of TIA.  He was encouraged to  wear a smart watch for home monitor of A-fib, no evidence of recurrence of A-fib has been found.  His blood pressure was well-controlled amlodipine 2.5 mg daily.  He was asymptomatic from mitral regurgitation.  Repeat echo on 09/07/2023 with LVEF 55 to 60%, no regional wall motion abnormality, indeterminate diastolic parameter, normal RV and PASP, mild MR, mild late systolic prolapse of both leaflets of the mitral valve.      Past Medical History:  Diagnosis Date   Cancer [facility])    prostate, 2002   H/O heart artery stent    Hypothyroidism     Past Surgical History:  Procedure Laterality Date   BACK SURGERY     CARDIAC CATHETERIZATION     CATARACT EXTRACTION Bilateral    PROSTATE SURGERY     REVERSE SHOULDER ARTHROPLASTY Right 11/06/2021   Procedure: REVERSE SHOULDER ARTHROPLASTY;  Surgeon: Robert Pippin, MD;  Location: [facility];  Service: Orthopedics;  Laterality: Right;   TONSILLECTOMY       Home Medications:  Prior to Admission medications   Medication Sig Start Date End Date Taking? Authorizing Provider  ALPRAZolam Prudy Feeler) 1 MG tablet Take 1 mg by mouth daily as needed for anxiety. 08/14/22  Yes [provider]  amLODipine (NORVASC) 2.5 MG tablet Take 2.5 mg by mouth daily.   Yes [provider]  buPROPion (WELLBUTRIN XL) 150 MG 24 hr tablet Take 150 mg by mouth 3 (three) times daily.   Yes [provider]  Carbidopa-Levodopa ER (SINEMET CR) 25-100 MG tablet controlled release TAKE 1 TABLET BY MOUTH AT 7 IN THE MORNING THEN TAKE 1 TABLET BY MOUTH AT NOON, THEN TAKE 1 TABLET BY MOUTH AT 5 IN THE EVENING 09/17/23  Yes Robert Foley, MD  clopidogrel (PLAVIX) 75 MG tablet Take 75 mg by mouth daily.   Yes [provider]  esomeprazole (NEXIUM) 20 MG capsule Take 20 mg by mouth daily at 12 noon.   Yes [provider]  levothyroxine (SYNTHROID) 75 MCG tablet TAKE 1 TABLET BY MOUTH DAILY BEFORE BREAKFAST 08/10/23  Yes Robert Northern, MD  mirtazapine  (REMERON) 45 MG tablet Take 45 mg by mouth at bedtime. 04/01/22  Yes [provider]  nitroGLYCERIN (NITROSTAT) 0.4 MG SL tablet Place 1 tablet (0.4 mg total) under the tongue every 5 (five) minutes as needed for chest pain. 08/25/22 10/31/23 Yes [name], Manish J, MD  rosuvastatin (CRESTOR) 40 MG tablet Take 40 mg by mouth daily.   Yes [provider]  thyroid (ARMOUR) 60 MG tablet Take 60 mg by mouth daily before breakfast.   Yes [provider]    Inpatient Medications: Scheduled Meds:  buPROPion  300 mg Oral Daily   Carbidopa-Levodopa ER  1 tablet Oral TID   cholecalciferol  5,000 Units Oral Daily   levothyroxine  75 mcg Oral Q0600   mirtazapine  45 mg Oral QHS   pantoprazole  40 mg Oral Daily   thyroid  60 mg Oral QAC breakfast   Continuous Infusions:  PRN Meds: ALPRAZolam  Allergies:   No Known Allergies  Social History:   Social History   Socioeconomic History   Marital status: Widowed    Spouse name: Not on file  Number of children: 3   Years of education: Not on file   Highest education level: Not on file  Occupational History   Not on file  Tobacco Use   Smoking status: Former    Current packs/day: 0.00    Average packs/day: 1 pack/day for 8.0 years (8.0 ttl pk-yrs)    Types: Cigarettes    Start date: 10/24/1962    Quit date: 10/24/1970    Years since quitting: 52.9   Smokeless tobacco: Never  Vaping Use   Vaping status: Never Used  Substance and Sexual Activity   Alcohol use: Yes    Alcohol/week: 2.0 standard drinks of alcohol    Types: 2 Glasses of wine per week    Comment: weekly   Drug use: Never   Sexual activity: Not on file  Other Topics Concern   Not on file  Social History Narrative   Caffiene 1-2 cup coffee daily.    Retired.   Salesman.   Social Determinants of Health   Financial Resource Strain: Not on file  Food Insecurity: No Food Insecurity (09/19/2023)   Hunger Vital Sign    Worried About Running Out of  Food in the Last Year: Never true    Ran Out of Food in the Last Year: Never true  Transportation Needs: No Transportation Needs (09/19/2023)   PRAPARE - Administrator, Civil Service (Medical): No    Lack of Transportation (Non-Medical): No  Physical Activity: Not on file  Stress: Not on file  Social Connections: Not on file  Intimate Partner Violence: Not At Risk (09/19/2023)   Humiliation, Afraid, Rape, and Kick questionnaire    Fear of Current or Ex-Partner: No    Emotionally Abused: No    Physically Abused: No    Sexually Abused: No    Family History:    Family History  Problem Relation Age of Onset   Stroke Sister 34   Lung cancer Sister    Colon cancer Neg Hx    Stomach cancer Neg Hx    Esophageal cancer Neg Hx    Parkinson's disease Neg Hx      ROS:  Constitutional: Denied fever, chills, malaise, night sweats Eyes: Denied vision change or loss Ears/Nose/Mouth/Throat: Denied ear ache, sore throat, coughing, sinus pain Cardiovascular: see HPI  Respiratory: see HPI  Gastrointestinal: Denied nausea, vomiting, abdominal pain, diarrhea Genital/Urinary: Denied dysuria, hematuria, urinary frequency/urgency Musculoskeletal: Denied muscle ache, joint pain, weakness Skin: Denied rash, wound Neuro: see HPI  Psych: depression/anxiety  Endocrine: Denied history of diabetes   Physical Exam/Data:   Vitals:   09/20/23 0442 09/20/23 0600 09/20/23 0800 09/20/23 0832  BP:  126/71 127/74 105/71  Pulse:  64 64 82  Resp:  14 16 14   Temp: 98.2 F (36.8 C)   98 F (36.7 C)  TempSrc: Oral   Oral  SpO2:  99% 96% 99%  Weight:      Height:        Intake/Output Summary (Last 24 hours) at 09/20/2023 1035 Last data filed at 09/20/2023 0836 Gross per 24 hour  Intake --  Output 300 ml  Net -300 ml      09/19/2023    1:48 PM 08/26/2023    1:04 PM 08/18/2023    1:57 PM  Last 3 Weights  Weight (lbs) 164 lb 173 lb 9.6 oz 173 lb 6 oz  Weight (kg) 74.39 kg 78.744 kg  78.642 kg     Body mass index is 22.87 kg/m.  Vitals:  Vitals:   09/20/23 0800 09/20/23 0832  BP: 127/74 105/71  Pulse: 64 82  Resp: 16 14  Temp:  98 F (36.7 C)  SpO2: 96% 99%   General Appearance: In no apparent distress, sitting in chair  HEENT: Normocephalic, atraumatic.  Neck: Supple, trachea midline, no JVDs Cardiovascular: Regular rate and rhythm, normal S1-S2,  soft murmur  Respiratory: Resting breathing unlabored, lungs sounds clear to auscultation bilaterally, no use of accessory muscles. On room air.  No wheezes, rales or rhonchi.   Gastrointestinal: Bowel sounds positive, abdomen soft, non-tender Extremities: Able to move all extremities in bed without difficulty, no edema of BLE Musculoskeletal: Normal muscle bulk and tone Skin: Intact, warm, dry. No rashes or petechiae noted in exposed areas.  Neurologic: Alert, oriented to person, place and time. Slightly slow in response to questions, but speaks appropriately and follows commands. No gross  focal deficit  Psychiatric: Flat affect   EKG:  The EKG was personally reviewed and demonstrates:    EKG from 09/19/2023 revealed Sinus rhythm 67 bpm, first-degree AV block, old nonspecific T wave abnormality  Telemetry:  Telemetry was personally reviewed and demonstrates:    Sinus rhythm 80s, occasional PVCs and PACs, sinus bradycardia 40-50s noted 09/19/23 afternoon and at night. First degree AVB.   Relevant CV Studies:   Echo from 09/07/23:   1. Left ventricular ejection fraction, by estimation, is 55 to 60%. The  left ventricle has normal function. The left ventricle has no regional  wall motion abnormalities. Left ventricular diastolic parameters are  indeterminate.   2. Right ventricular systolic function is normal. The right ventricular  size is normal. There is normal pulmonary artery systolic pressure.   3. The mitral valve is normal in structure. Mild mitral valve  regurgitation. There is mild late  systolic prolapse of both leaflets of  the mitral valve.   4. The aortic valve is tricuspid. Aortic valve regurgitation is not  visualized. Aortic valve sclerosis is present, with no evidence of aortic  valve stenosis.   Modified Bruce exercise Tetrofosmin stress test 09/04/2022: Exercise nuclear stress test was performed using modified Bruce protocol. Patient reached 3.7 METS, and 85% of age predicted maximum heart rate. Exercise capacity was low. No chest pain reported. Heart rate and hemodynamic response were normal. Stress EKG revealed no ischemic changes. Normal myocardial perfusion. Stress LVEF 65%. Low risk study.    Mobile cardiac telemetry 13 days 12/13/2021 - 12/27/2021: Dominant rhythm: Sinus. HR 40-130 bpm. Avg HR 69 bpm, in sinus rhythm with first degree AV block. 12 episodes of SVT, fastest at 154 bpm for 8 beats, longest for 12 beats at 114 bpm. <1% isolated SVE, couplet/triplets. 3 episodes listed as VT, more likely to be SVT with aberrent conduction, fastest at 190 bpm for 5 beats, longest for 19 beats at 113 bpm. <1% isolated VE, couplet/triplets. No atrial fibrillation/atrial flutter/high grade AV block, sinus pause >3sec noted. 1 patient triggered event, correlated with sinus rhythm.   Laboratory Data:  High Sensitivity Troponin:  No results for input(s): "TROPONINIHS" in the last 720 hours.   Chemistry Recent Labs  Lab 09/19/23 1533 09/20/23 0320  NA 140 140  K 3.9 4.3  CL 106 108  CO2 26 23  GLUCOSE 104* 94  BUN 14 17  CREATININE 1.07 0.93  CALCIUM 8.9 8.8*  GFRNONAA >60 >60  ANIONGAP 8 9    Recent Labs  Lab 09/19/23 1533 09/20/23 0320  PROT 6.8 6.7  ALBUMIN 3.4* 3.4*  AST 23 30  ALT 32 37  ALKPHOS 104 99  BILITOT 0.6 0.5   Lipids No results for input(s): "CHOL", "TRIG", "HDL", "LABVLDL", "LDLCALC", "CHOLHDL" in the last 168 hours.  Hematology Recent Labs  Lab 09/19/23 1402 09/20/23 0320  WBC 7.6 8.0  RBC 5.32 4.81  HGB 15.7 14.7  HCT  49.8 45.1  MCV 93.6 93.8  MCH 29.5 30.6  MCHC 31.5 32.6  RDW 12.8 13.0  PLT 267 288   Thyroid No results for input(s): "TSH", "FREET4" in the last 168 hours.  BNPNo results for input(s): "BNP", "PROBNP" in the last 168 hours.  DDimer No results for input(s): "DDIMER" in the last 168 hours.   Radiology/Studies:  CT HEAD WO CONTRAST  Result Date: 09/20/2023 CLINICAL DATA:  Subdural hematoma follow-up EXAM: CT HEAD WITHOUT CONTRAST TECHNIQUE: Contiguous axial images were obtained from the base of the skull through the vertex without intravenous contrast. RADIATION DOSE REDUCTION: This exam was performed according to the departmental dose-optimization program which includes automated exposure control, adjustment of the mA and/or kV according to patient size and/or use of iterative reconstruction technique. COMPARISON:  CT head 09/19/2023 FINDINGS: Brain: Unchanged 9 mm subdural fluid collection overlying the left cerebral convexity. Mild associated mass effect on the left frontal lobe. No midline shift. Basal cisterns are patent. No hydrocephalus. Age-commensurate cerebral atrophy and chronic small vessel ischemic disease. Vascular: No hyperdense vessel. Intracranial arterial calcification. Skull: No fracture or focal lesion. Sinuses/Orbits: No acute finding. Other: None. IMPRESSION: Unchanged 9 mm low-density subdural fluid collection overlying the left cerebral convexity. Mild associated mass effect on the left frontal lobe. No midline shift. Electronically Signed   By: Minerva Fester M.D.   On: 09/20/2023 00:22   CT Head Wo Contrast  Result Date: 09/19/2023 CLINICAL DATA:  Multiple falls over last 2 weeks, altered mental status. EXAM: CT HEAD WITHOUT CONTRAST TECHNIQUE: Contiguous axial images were obtained from the base of the skull through the vertex without intravenous contrast. RADIATION DOSE REDUCTION: This exam was performed according to the departmental dose-optimization program which  includes automated exposure control, adjustment of the mA and/or kV according to patient size and/or use of iterative reconstruction technique. COMPARISON:  None Available. FINDINGS: Brain: There is a subdural fluid collection overlying the left cerebral convexity which measures up to 9 mm in thickness. This measures above simple fluid attenuation at approximately 23 Hounsfield units. There is mild associated mass effect on the left frontal and parietal lobes. There is no midline shift. The basilar cisterns are patent. There is mild cerebral volume loss with associated ex vacuo dilatation. Periventricular white matter hypoattenuation likely represents chronic small vessel ischemic disease. Vascular: There are vascular calcifications in the carotid siphons. Skull: Normal. Negative for fracture or focal lesion. Sinuses/Orbits: No acute finding. Other: None. 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 No midline shift. These results were called by telephone at the time of interpretation on 09/19/2023 at 3:42 pm to provider Robert. Suezanne Jacquet, who verbally acknowledged these results. Electronically Signed   By: Romona Curls M.D.   On: 09/19/2023 15:46   DG Chest 2 View  Result Date: 09/19/2023 CLINICAL DATA:  Altered mental status, shortness of breath EXAM: CHEST - 2 VIEW COMPARISON:  11/17/2021 FINDINGS: The heart size and mediastinal contours are within normal limits. Both lungs are clear. Status post right shoulder reverse arthroplasty. IMPRESSION: No acute abnormality of the lungs. Electronically  Signed   By: Jearld Lesch M.D.   On: 09/19/2023 15:33     Assessment and Plan:   Sinus bradycardia   First degree AVB Possible syncope 3 weeks ago  - he does not reports typical symptoms of profound bradycardia, falls are related to mobility issue, he has no angina or equivalent symptoms,  unclear history for possible syncope 3 weeks ago while driving - telemetry showed sinus  bradycardia 40-50s at night, no high grade AVB or long pauses  so far - Echo from 09/07/23 reassuring  - OK to arrange outpatient Zio for 2 weeks at time of discharge  - Consider check TSH, suspect progression of PD, not on AVN blocking agents,  no further workup for cardiology at this time planned, please NO driving further   CAD with LAD stent 2003  - no angina symptoms - continue Plavix and statin if neurosurgery OK with antiplatelet use   Paroxysmal A fib - single episode in 2022 COVID admission, <24 hours, no recurrence so far, not on DOAC historically, no change of management at this time   HTN - BP low normal, orthostatic VS negative, will stop amlodipine 2.5mg  to avoid hypotension   Falls Debility Small left sided hygroma  Parkinson disease  Memory loss Hx of TIA - per primary team      Risk Assessment/Risk Scores:   CHA2DS2-VASc Score = 6  This indicates a 9.7% annual risk of stroke. The patient's score is based upon: CHF History: 0 HTN History: 1 Diabetes History: 0 Stroke History: 2 Vascular Disease History: 1 Age Score: 2 Gender Score: 0    For questions or updates, please contact [facility] Please consult [email or web address] for contact info under    Signed, Cyndi Bender, NP  09/20/2023 10:35 AM  Attending Attestation  Patient seen and examined, note reviewed with the signed Resident Physician/Advanced Practice Provider. I personally reviewed laboratory data, imaging studies and relevant notes. I independently examined the patient and formulated the important aspects of the plan. I have personally discussed the plan with the patient and/or family. Comments or changes to the note/plan are indicated below.  My Exam:  General: Well nourished, well developed, in no acute distress Head: Atraumatic, normal size  Eyes: PEERLA, EOMI  Neck: Supple, no JVD Endocrine:  No thryomegaly Cardiac: Normal S1, S2; RRR; 2/6 SEM Lungs: Clear to auscultation bilaterally, no wheezing, rhonchi or rales  Abd: Soft, nontender, no hepatomegaly  Ext: No edema, pulses 2+ Musculoskeletal: No deformities, BUE and BLE strength normal and equal Skin: Warm and dry, no rashes   Neuro: Alert and oriented to person, place, time, and situation, CNII-XII grossly intact, no focal deficits  Psych: Normal mood and affect   Telemetry: SR 70s EKG: SR 67 bpm, 1AVB Echo: EF 55-60% Cath: Nuclear stress normal   Assessment & Plan: 82 year old male with history of CAD status post PCI to LAD in 2003, Parkinson's disease, mitral valve prolapse, paroxysmal atrial fibrillation not on anticoagulation admitted on 09/19/2023 with fall.  Cardiology consulted for sinus bradycardia.  # Sinus bradycardia -Likely incidentally found.  On no AV nodal agents.  Heart rate comes up appropriately with talking.  Do not believe this is contributing.  Check TSH.  Recent echo was normal.  # Falls # Syncope -Suspect this is more related to Parkinson disease.  No high-grade conduction disease has been identified on EKG.  Echo is normal.  We will plan for outpatient monitor.  Would recommend to hold amlodipine.  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  No driving for 6 months until symptoms resolve or identifiable causes found.  Unfortunately I do not believe we will find a cardiac etiology.  # Parkinson's disease # Falls -Per hospital medicine.  He will benefit from PT as an outpatient.  # CAD -Resume home Plavix at the discretion of the neurosurgery.  Continue statin.  No symptoms of angina.  [facility] will sign off.   Medication Recommendations:  As above Other recommendations (labs, testing, etc):  Outpatient monitor Follow up as an outpatient:  With Robert. Rosemary Horne  Signed, Lenna Gilford. Flora Lipps, MD [facility]  [facility]  09/20/2023 10:48 AM

## 2023-09-20 NOTE — ED Notes (Signed)
Assisted to chair to eat breakfast. Gait unsteady. No complaints. Able to feed self. Call bell in reach. Voided in urinal.

## 2023-09-20 NOTE — ED Notes (Signed)
ED TO INPATIENT HANDOFF REPORT  ED Nurse Name and Phone #:  Tesha Archambeau [phone]  S Name/Age/Gender Robert Horne 82 y.o. male Room/Bed: 046C/046C  Code Status   Code Status: Full Code  Home/SNF/Other Home Patient oriented to: self, place, time, and situation Is this baseline? Yes   Triage Complete: Triage complete  Chief Complaint Acute subdural hematoma (HCC) [S06.5XAA]  Triage Note Pt accompanied by daughter who reports multiple falls over the past 2 weeks, last fall was about 2 days ago. Pt had hx of parkinsons and has increased tremors lately. Pt lives at an Independent living facility. Pt denies any injuries from his falls.    Allergies No Known Allergies  Level of Care/Admitting Diagnosis ED Disposition     ED Disposition  Admit   Condition  --   Comment  Hospital Area: [facility] [[id number]]  Level of Care: Telemetry Cardiac [103]  May admit patient to [facility] or [facility] if equivalent level of care is available:: No  Covid Evaluation: Confirmed COVID Negative  Diagnosis: Acute subdural hematoma [facility]) [[id number]]  Admitting Physician: Rhetta Mura [phone]  Attending Physician: Rhetta Mura [phone]  Certification:: I certify this patient will need inpatient services for at least 2 midnights  Expected Medical Readiness: 09/21/2023          B Medical/Surgery History Past Medical History:  Diagnosis Date   Cancer [facility])    prostate, 2002   H/O heart artery stent    Hypothyroidism    Past Surgical History:  Procedure Laterality Date   BACK SURGERY     CARDIAC CATHETERIZATION     CATARACT EXTRACTION Bilateral    PROSTATE SURGERY     REVERSE SHOULDER ARTHROPLASTY Right 11/06/2021   Procedure: REVERSE SHOULDER ARTHROPLASTY;  Surgeon: [name], MD;  Location: [facility];  Service: Orthopedics;  Laterality: Right;   TONSILLECTOMY       A IV Location/Drains/Wounds Patient Lines/Drains/Airways Status     Active  Line/Drains/Airways     Name Placement date Placement time Site Days   Peripheral IV 09/19/23 22 G 1" Posterior;Right Hand 09/19/23  1404  Hand  1   Incision (Closed) 11/06/21 Shoulder Right 11/06/21  0932  -- 683            Intake/Output Last 24 hours  Intake/Output Summary (Last 24 hours) at 09/20/2023 1255 Last data filed at 09/20/2023 7829 Gross per 24 hour  Intake --  Output 300 ml  Net -300 ml    Labs/Imaging Results for orders placed or performed during the hospital encounter of 09/19/23 (from the past 48 hour(s))  Urinalysis, Routine w reflex microscopic -Urine, Clean Catch     Status: None   Collection Time: 09/19/23  2:01 PM  Result Value Ref Range   Color, Urine YELLOW YELLOW   APPearance CLEAR CLEAR   Specific Gravity, Urine 1.006 1.005 - 1.030   pH 6.0 5.0 - 8.0   Glucose, UA NEGATIVE NEGATIVE mg/dL   Hgb urine dipstick NEGATIVE NEGATIVE   Bilirubin Urine NEGATIVE NEGATIVE   Ketones, ur NEGATIVE NEGATIVE mg/dL   Protein, ur NEGATIVE NEGATIVE mg/dL   Nitrite NEGATIVE NEGATIVE   Leukocytes,Ua NEGATIVE NEGATIVE    Comment: Performed at [facility], [address] [place], [place] [address]  CBC with Differential     Status: None   Collection Time: 09/19/23  2:02 PM  Result Value Ref Range   WBC 7.6 4.0 - 10.5 K/uL   RBC 5.32 4.22 -  5.81 MIL/uL   Hemoglobin 15.7 13.0 - 17.0 g/dL   HCT 16.1 09.6 - 04.5 %   MCV 93.6 80.0 - 100.0 fL   MCH 29.5 26.0 - 34.0 pg   MCHC 31.5 30.0 - 36.0 g/dL   RDW 40.9 81.1 - 91.4 %   Platelets 267 150 - 400 K/uL   nRBC 0.0 0.0 - 0.2 %   Neutrophils Relative % 53 %   Neutro Abs 4.0 1.7 - 7.7 K/uL   Lymphocytes Relative 36 %   Lymphs Abs 2.7 0.7 - 4.0 K/uL   Monocytes Relative 9 %   Monocytes Absolute 0.7 0.1 - 1.0 K/uL   Eosinophils Relative 1 %   Eosinophils Absolute 0.1 0.0 - 0.5 K/uL   Basophils Relative 1 %   Basophils Absolute 0.1 0.0 - 0.1 K/uL   Immature Granulocytes 0 %   Abs Immature Granulocytes 0.02  0.00 - 0.07 K/uL    Comment: Performed at [facility], [address], [place], [place] [address]  CBG monitoring, ED     Status: Abnormal   Collection Time: 09/19/23  2:07 PM  Result Value Ref Range   Glucose-Capillary 126 (H) 70 - 99 mg/dL    Comment: Glucose reference range applies only to samples taken after fasting for at least 8 hours.  Comprehensive metabolic panel     Status: Abnormal   Collection Time: 09/19/23  3:33 PM  Result Value Ref Range   Sodium 140 135 - 145 mmol/L   Potassium 3.9 3.5 - 5.1 mmol/L   Chloride 106 98 - 111 mmol/L   CO2 26 22 - 32 mmol/L   Glucose, Bld 104 (H) 70 - 99 mg/dL    Comment: Glucose reference range applies only to samples taken after fasting for at least 8 hours.   BUN 14 8 - 23 mg/dL   Creatinine, Ser 6.21 0.61 - 1.24 mg/dL   Calcium 8.9 8.9 - 30.8 mg/dL   Total Protein 6.8 6.5 - 8.1 g/dL   Albumin 3.4 (L) 3.5 - 5.0 g/dL   AST 23 15 - 41 U/L   ALT 32 0 - 44 U/L   Alkaline Phosphatase 104 38 - 126 U/L   Total Bilirubin 0.6 0.3 - 1.2 mg/dL   GFR, Estimated >65 >78 mL/min    Comment: (NOTE) Calculated using the CKD-EPI Creatinine Equation (2021)    Anion gap 8 5 - 15    Comment: Performed at [facility], [address], [place], [place] [address]  Comprehensive metabolic panel     Status: Abnormal   Collection Time: 09/20/23  3:20 AM  Result Value Ref Range   Sodium 140 135 - 145 mmol/L   Potassium 4.3 3.5 - 5.1 mmol/L   Chloride 108 98 - 111 mmol/L   CO2 23 22 - 32 mmol/L   Glucose, Bld 94 70 - 99 mg/dL    Comment: Glucose reference range applies only to samples taken after fasting for at least 8 hours.   BUN 17 8 - 23 mg/dL   Creatinine, Ser 9.52 0.61 - 1.24 mg/dL   Calcium 8.8 (L) 8.9 - 10.3 mg/dL   Total Protein 6.7 6.5 - 8.1 g/dL   Albumin 3.4 (L) 3.5 - 5.0 g/dL   AST 30 15 - 41 U/L   ALT 37 0 - 44 U/L   Alkaline Phosphatase 99 38 - 126 U/L   Total Bilirubin 0.5 0.3 - 1.2 mg/dL   GFR, Estimated >84 >13  mL/min     Comment: (NOTE) Calculated using the CKD-EPI Creatinine Equation (2021)    Anion gap 9 5 - 15    Comment: Performed at [facility], [address], [place], [place] [address]  Protime-INR     Status: None   Collection Time: 09/20/23  3:20 AM  Result Value Ref Range   Prothrombin Time 13.5 11.4 - 15.2 seconds   INR 1.0 0.8 - 1.2    Comment: (NOTE) INR goal varies based on device and disease states. Performed at [facility], [address], [place], [place] [address]   CBC     Status: None   Collection Time: 09/20/23  3:20 AM  Result Value Ref Range   WBC 8.0 4.0 - 10.5 K/uL   RBC 4.81 4.22 - 5.81 MIL/uL   Hemoglobin 14.7 13.0 - 17.0 g/dL   HCT 09.8 11.9 - 14.7 %   MCV 93.8 80.0 - 100.0 fL   MCH 30.6 26.0 - 34.0 pg   MCHC 32.6 30.0 - 36.0 g/dL   RDW 82.9 56.2 - 13.0 %   Platelets 288 150 - 400 K/uL   nRBC 0.0 0.0 - 0.2 %    Comment: Performed at [facility], [address] [place], [place] [address]   CT HEAD WO CONTRAST  Result Date: 09/20/2023 CLINICAL DATA:  Subdural hematoma follow-up EXAM: CT HEAD WITHOUT CONTRAST TECHNIQUE: Contiguous axial images were obtained from the base of the skull through the vertex without intravenous contrast. RADIATION DOSE REDUCTION: This exam was performed according to the departmental dose-optimization program which includes automated exposure control, adjustment of the mA and/or kV according to patient size and/or use of iterative reconstruction technique. COMPARISON:  CT head 09/19/2023 FINDINGS: Brain: Unchanged 9 mm subdural fluid collection overlying the left cerebral convexity. Mild associated mass effect on the left frontal lobe. No midline shift. Basal cisterns are patent. No hydrocephalus. Age-commensurate cerebral atrophy and chronic small vessel ischemic disease. Vascular: No hyperdense vessel. Intracranial arterial calcification. Skull: No fracture or focal lesion. Sinuses/Orbits: No acute finding. Other: None.  IMPRESSION: Unchanged 9 mm low-density subdural fluid collection overlying the left cerebral convexity. Mild associated mass effect on the left frontal lobe. No midline shift. Electronically Signed   By: Minerva Fester M.D.   On: 09/20/2023 00:22   CT Head Wo Contrast  Result Date: 09/19/2023 CLINICAL DATA:  Multiple falls over last 2 weeks, altered mental status. EXAM: CT HEAD WITHOUT CONTRAST TECHNIQUE: Contiguous axial images were obtained from the base of the skull through the vertex without intravenous contrast. RADIATION DOSE REDUCTION: This exam was performed according to the departmental dose-optimization program which includes automated exposure control, adjustment of the mA and/or kV according to patient size and/or use of iterative reconstruction technique. COMPARISON:  None Available. FINDINGS: Brain: There is a subdural fluid collection overlying the left cerebral convexity which measures up to 9 mm in thickness. This measures above simple fluid attenuation at approximately 23 Hounsfield units. There is mild associated mass effect on the left frontal and parietal lobes. There is no midline shift. The basilar cisterns are patent. There is mild cerebral volume loss with associated ex vacuo dilatation. Periventricular white matter hypoattenuation likely represents chronic small vessel ischemic disease. Vascular: There are vascular calcifications in the carotid siphons. Skull: Normal. Negative for fracture or focal lesion. Sinuses/Orbits: No acute finding. Other: None. 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 No midline shift. These results were called by telephone at the time of interpretation on 09/19/2023 at 3:42 pm to provider Dr. Suezanne Jacquet, who verbally acknowledged these results. Electronically Signed   By: Romona Curls M.D.   On: 09/19/2023 15:46   DG Chest 2  View  Result Date: 09/19/2023 CLINICAL DATA:  Altered mental status, shortness of breath EXAM: CHEST - 2 VIEW COMPARISON:  11/17/2021 FINDINGS: The heart size and mediastinal contours are within normal limits. Both lungs are clear. Status post right shoulder reverse arthroplasty. IMPRESSION: No acute abnormality of the lungs. Electronically Signed   By: Jearld Lesch M.D.   On: 09/19/2023 15:33    Pending Labs Unresulted Labs (From admission, onward)    None       Vitals/Pain Today's Vitals   09/20/23 0832 09/20/23 1135 09/20/23 1253 09/20/23 1254  BP: 105/71 (!) 159/86  131/68  Pulse: 82 80  (!) 58  Resp: 14 15  14   Temp: 98 F (36.7 C)   98 F (36.7 C)  TempSrc: Oral   Oral  SpO2: 99% 99%  99%  Weight:      Height:      PainSc:   0-No pain     Isolation Precautions No active isolations  Medications Medications  mirtazapine (REMERON) tablet 45 mg (45 mg Oral Given 09/19/23 2111)  Carbidopa-Levodopa ER (SINEMET CR) 25-100 MG tablet controlled release 1 tablet (1 tablet Oral Given 09/20/23 1159)  levothyroxine (SYNTHROID) tablet 75 mcg (75 mcg Oral Given 09/20/23 0512)  cholecalciferol (VITAMIN D3) 25 MCG (1000 UNIT) tablet 5,000 Units (5,000 Units Oral Given 09/20/23 1118)  ALPRAZolam (XANAX) tablet 0.25 mg (0.25 mg Oral Given 09/19/23 2015)  pantoprazole (PROTONIX) EC tablet 40 mg (40 mg Oral Given 09/20/23 1115)  buPROPion (WELLBUTRIN XL) 24 hr tablet 300 mg (300 mg Oral Given 09/20/23 1114)  sodium chloride 0.9 % bolus 500 mL (0 mLs Intravenous Stopped 09/19/23 1518)    Mobility walks     Focused Assessments Neuro Assessment Handoff:  Swallow screen pass? Yes          Neuro Assessment: Within Defined Limits Neuro Checks:      Has TPA been given? No If patient is a Neuro Trauma and patient is going to OR before floor call report to 4N Charge nurse: [phone] or [phone]   R Recommendations: See Admitting Provider Note  Report given to:    Additional Notes:  Unsteady on feet while doing orthostatics and getting to chair to eat. Waiting for PT to evaluate before attempting walking. Neuro status WDL.

## 2023-09-20 NOTE — Plan of Care (Signed)

## 2023-09-20 NOTE — Progress Notes (Signed)
2-week ZIO monitor ordered for possible syncope and sinus bradycardia, [name] to follow

## 2023-09-21 ENCOUNTER — Inpatient Hospital Stay (INDEPENDENT_AMBULATORY_CARE_PROVIDER_SITE_OTHER): Payer: Medicare Other

## 2023-09-21 ENCOUNTER — Encounter: Payer: Self-pay | Admitting: *Deleted

## 2023-09-21 DIAGNOSIS — R001 Bradycardia, unspecified: Secondary | ICD-10-CM

## 2023-09-21 DIAGNOSIS — S065XAA Traumatic subdural hemorrhage with loss of consciousness status unknown, initial encounter: Secondary | ICD-10-CM | POA: Diagnosis not present

## 2023-09-21 DIAGNOSIS — R55 Syncope and collapse: Secondary | ICD-10-CM

## 2023-09-21 DIAGNOSIS — I440 Atrioventricular block, first degree: Secondary | ICD-10-CM

## 2023-09-21 MED ORDER — DIALYVITE VITAMIN D 5000 125 MCG (5000 UT) PO CAPS: 5000.0000 [IU] | ORAL_CAPSULE | Freq: Every day | ORAL | Status: AC

## 2023-09-21 MED ORDER — BUPROPION HCL ER (XL) 300 MG PO TB24: 300.0000 mg | ORAL_TABLET | Freq: Every day | ORAL | 0 refills | Status: DC

## 2023-09-21 MED ORDER — ALPRAZOLAM 1 MG PO TABS: 1.0000 mg | ORAL_TABLET | Freq: Every day | ORAL | 0 refills | Status: AC | PRN

## 2023-09-21 MED ORDER — MIRTAZAPINE 45 MG PO TABS: 45.0000 mg | ORAL_TABLET | Freq: Every day | ORAL | 0 refills | Status: AC

## 2023-09-21 NOTE — Discharge Summary (Signed)
Physician Discharge Summary  Robert Horne XBM:[id number] DOB: [birth year] DOA: 09/19/2023  PCP: [name], MD  Admit date: 09/19/2023 Discharge date: 09/21/2023  Time spent: 33 minutes  Recommendations for Outpatient Follow-up:  Do not resume amlodipine or Plavix until patient can be seen by neurosurgery in the outpatient setting--CC Dr. Marikay Alar, CC [name] Needs Chem-7 CBC in 1 week  Discharge Diagnoses:  MAIN problem for hospitalization   Subdural hemorrhage on CT scan secondary to fall Underlying POTS syndrome parkinsonism Hypothyroid CAD Pathological grief   Please see below for itemized issues addressed in HOpsital- refer to other progress notes for clarity if needed  Discharge Condition: Improved  Diet recommendation: Heart healthy  Filed Weights   09/19/23 1348  Weight: 74.4 kg    History of present illness:  82 year old male ILF [facility] resident since 2022 CAD status post PCI 2003 with multiple caths in the past-stress test [name] 08/2022 normal perfusion study EF 65% low risk TIA 2017 Prior prostate cancer Right shoulder arthroplasty 2014 Atrial fibrillation since 10/2021 admission CHADVASC >5--- he is not on anticoagulation after discussion with his cardiologist 07/23/2023 office visit OSA intolerant of CPAP Hypothyroidism Depression Parkinsonism on levodopa since 09/2022 followed by [name] neurology associated with some mild cognitive decline He was seen about a week ago by cardiology 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 and frequent falls resulting in trauma Cardiology consult-has appropriate chronotropy to stimulation goes to the 60s-discontinue amlodipine-outpatient ZIO monitor I will CC his primary cardiologist to ensure that he is followed once this monitor has been placed and so that a decision can be made if needed about further  workup   Subdural hemorrhage on CT scan on admission Not a candidate for Plavix-defer to neurosurgery when to resume--- therapy t cleared the patient to discharge as he had walked around 300 feet at time of discharge  A-fib CHADVASC >5, Hasbled>4 Not a candidate for anticoagulation--no rate control at this time but does need monitoring with the patch as above   CAD 2003-see above discussions   Hypothyroid Discontinue Armour Thyroid while in hospital continue Synthroid alone repeat TSH in 3 weeks Do not repeat TSH in hospital as it will be inaccurate steady-state metabolism-I canceled the order   OSA intolerant to CPAP   Parkinsonism Keep on usual scheduled meds Sinemet 3 times daily   Pathological grief response Cut back Wellbutrin from 450--->300 which was prior dose, continue Remeron 45, Xanax 0.25 sleep There are reports of Wellbutrin causing dizziness in addition to other side effects so I have cut back the dose as above and I would recommend tapering this back to the lowest possible dose and even discontinuing the same in the outpatient setting   Discharge Exam: Vitals:   09/21/23 0711 09/21/23 1121  BP: 122/75 (!) 153/77  Pulse: 63 70  Resp: 17 16  Temp: 97.9 F (36.6 C)   SpO2: 98%     Subj on day of d/c   Looks well feels well was working with therapy when I saw him Reviewed subsequently he has no specific issues  General Exam on discharge  EOMI NCAT no focal deficit No icterus no pallor CTAB no added sound wheeze rales rhonchi S1-S2 no murmur seems to be in sinus on exam Abdomen is soft no rebound no guarding Neuro is intact moving 4 limbs equally  Discharge Instructions   Discharge Instructions     Diet -  low sodium heart healthy   Complete by: As directed    Discharge instructions   Complete by: As directed    Hold all AV nodal agents-a Zio patch monitor will be ordered for the patient to rule out occult arrhythmia Patient should also not be  placed on high doses of Wellbutrin at his advanced age instead I would recommend a lower dose of 300 and taper to off and get psychology involved in his care Do not use Armour Thyroid in addition to regular Synthroid instead use regular Synthroid and repeat a TSH in several weeks Do not use Plavix until the patient has been seen by neurosurgery in the outpatient setting   Increase activity slowly   Complete by: As directed       Allergies as of 09/21/2023   No Known Allergies      Medication List     STOP taking these medications    amLODipine 2.5 MG tablet Commonly known as: NORVASC   clopidogrel 75 MG tablet Commonly known as: PLAVIX   thyroid 60 MG tablet Commonly known as: ARMOUR       TAKE these medications    ALPRAZolam 1 MG tablet Commonly known as: XANAX Take 1 mg by mouth daily as needed for anxiety.   buPROPion 300 MG 24 hr tablet Commonly known as: WELLBUTRIN XL Take 1 tablet (300 mg total) by mouth daily. Start taking on: September 22, 2023 What changed:  medication strength how much to take when to take this   Carbidopa-Levodopa ER 25-100 MG tablet controlled release Commonly known as: SINEMET CR TAKE 1 TABLET BY MOUTH AT 7 IN THE MORNING THEN TAKE 1 TABLET BY MOUTH AT NOON, THEN TAKE 1 TABLET BY MOUTH AT 5 IN THE EVENING   Dialyvite Vitamin D 5000 125 MCG (5000 UT) capsule Generic drug: Cholecalciferol Take 1 capsule (5,000 Units total) by mouth daily.   esomeprazole 20 MG capsule Commonly known as: NEXIUM Take 20 mg by mouth daily at 12 noon.   levothyroxine 75 MCG tablet Commonly known as: SYNTHROID TAKE 1 TABLET BY MOUTH DAILY BEFORE BREAKFAST   mirtazapine 45 MG tablet Commonly known as: REMERON Take 45 mg by mouth at bedtime.   nitroGLYCERIN 0.4 MG SL tablet Commonly known as: NITROSTAT Place 1 tablet (0.4 mg total) under the tongue every 5 (five) minutes as needed for chest pain.   rosuvastatin 40 MG tablet Commonly known as:  CRESTOR Take 40 mg by mouth daily.       No Known Allergies  Follow-up Information     Arman Bogus, MD. Schedule an appointment as soon as possible for a visit in 3 week(s).   Specialty: Neurosurgery Contact information: [address] [place] [address] [phone]                  The results of significant diagnostics from this hospitalization (including imaging, microbiology, ancillary and laboratory) are listed below for reference.    Significant Diagnostic Studies: CT HEAD WO CONTRAST  Result Date: 09/20/2023 CLINICAL DATA:  Subdural hematoma follow-up EXAM: CT HEAD WITHOUT CONTRAST TECHNIQUE: Contiguous axial images were obtained from the base of the skull through the vertex without intravenous contrast. RADIATION DOSE REDUCTION: This exam was performed according to the departmental dose-optimization program which includes automated exposure control, adjustment of the mA and/or kV according to patient size and/or use of iterative reconstruction technique. COMPARISON:  CT head 09/19/2023 FINDINGS: Brain: Unchanged 9 mm subdural fluid collection  overlying the left cerebral convexity. Mild associated mass effect on the left frontal lobe. No midline shift. Basal cisterns are patent. No hydrocephalus. Age-commensurate cerebral atrophy and chronic small vessel ischemic disease. Vascular: No hyperdense vessel. Intracranial arterial calcification. Skull: No fracture or focal lesion. Sinuses/Orbits: No acute finding. Other: None. IMPRESSION: Unchanged 9 mm low-density subdural fluid collection overlying the left cerebral convexity. Mild associated mass effect on the left frontal lobe. No midline shift. Electronically Signed   By: Minerva Fester M.D.   On: 09/20/2023 00:22   CT Head Wo Contrast  Result Date: 09/19/2023 CLINICAL DATA:  Multiple falls over last 2 weeks, altered mental status. EXAM: CT HEAD WITHOUT CONTRAST TECHNIQUE: Contiguous axial  images were obtained from the base of the skull through the vertex without intravenous contrast. RADIATION DOSE REDUCTION: This exam was performed according to the departmental dose-optimization program which includes automated exposure control, adjustment of the mA and/or kV according to patient size and/or use of iterative reconstruction technique. COMPARISON:  None Available. FINDINGS: Brain: There is a subdural fluid collection overlying the left cerebral convexity which measures up to 9 mm in thickness. This measures above simple fluid attenuation at approximately 23 Hounsfield units. There is mild associated mass effect on the left frontal and parietal lobes. There is no midline shift. The basilar cisterns are patent. There is mild cerebral volume loss with associated ex vacuo dilatation. Periventricular white matter hypoattenuation likely represents chronic small vessel ischemic disease. Vascular: There are vascular calcifications in the carotid siphons. Skull: Normal. Negative for fracture or focal lesion. Sinuses/Orbits: No acute finding. Other: None. 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 No midline shift. These results were called by telephone at the time of interpretation on 09/19/2023 at 3:42 pm to provider Dr. Suezanne Jacquet, who verbally acknowledged these results. Electronically Signed   By: Romona Curls M.D.   On: 09/19/2023 15:46   DG Chest 2 View  Result Date: 09/19/2023 CLINICAL DATA:  Altered mental status, shortness of breath EXAM: CHEST - 2 VIEW COMPARISON:  11/17/2021 FINDINGS: The heart size and mediastinal contours are within normal limits. Both lungs are clear. Status post right shoulder reverse arthroplasty. IMPRESSION: No acute abnormality of the lungs. Electronically Signed   By: Jearld Lesch M.D.   On: 09/19/2023 15:33   ECHOCARDIOGRAM COMPLETE  Result  Date: 09/07/2023    ECHOCARDIOGRAM REPORT   Patient Name:   Robert Horne  Date of Exam: 09/07/2023 Medical Rec #:  [id number]     Height:       71.0 in Accession #:    [id number]    Weight:       173.6 lb Date of Birth:  1941/01/05     BSA:          1.985 m Patient Age:    82 years      BP:           112/69 mmHg Patient Gender: M             HR:           65 bpm. Exam Location:  [address] Procedure: 2D Echo, Color Doppler, Cardiac Doppler, 3D Echo and Strain Analysis Indications:    Nonrheumatic Mitral Valve Regurgitation I34.0  History:        Patient has prior history of Echocardiogram examinations, most  recent 11/18/2021.  Sonographer:    Thurman Coyer RDCS Referring Phys: [id number] [facility] J [name] IMPRESSIONS  1. Left ventricular ejection fraction, by estimation, is 55 to 60%. The left ventricle has normal function. The left ventricle has no regional wall motion abnormalities. Left ventricular diastolic parameters are indeterminate.  2. Right ventricular systolic function is normal. The right ventricular size is normal. There is normal pulmonary artery systolic pressure.  3. The mitral valve is normal in structure. Mild mitral valve regurgitation. 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  4. The aortic valve is tricuspid. Aortic valve regurgitation is not visualized. Aortic valve sclerosis is present, with no evidence of aortic valve stenosis. FINDINGS  Left Ventricle: Left ventricular ejection fraction, by estimation, is 55 to 60%. The left ventricle has normal function. The left ventricle has no regional wall motion abnormalities. Global longitudinal strain performed but not reported based on interpreter judgement due to suboptimal tracking. The left ventricular internal cavity size was normal in size. There is no left ventricular hypertrophy. Left ventricular diastolic parameters are indeterminate. Right Ventricle: The right ventricular size is  normal. Right vetricular wall thickness was not assessed. Right ventricular systolic function is normal. There is normal pulmonary artery systolic pressure. The tricuspid regurgitant velocity is 2.17 m/s, and with an assumed right atrial pressure of 3 mmHg, the estimated right ventricular systolic pressure is 21.8 mmHg. Left Atrium: Left atrial size was normal in size. Right Atrium: Right atrial size was normal in size. Pericardium: There is no evidence of pericardial effusion. Mitral Valve: The mitral valve is normal in structure. 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 There is mild thickening of the mitral valve leaflet(s). Mild mitral valve regurgitation. Tricuspid Valve: The tricuspid valve is normal in structure. Tricuspid valve regurgitation is trivial. Aortic Valve: The aortic valve is tricuspid. Aortic valve regurgitation is not visualized. Aortic valve sclerosis is present, with no evidence of aortic valve stenosis. Pulmonic Valve: The pulmonic valve was not well visualized. Pulmonic valve regurgitation is not visualized. No evidence of pulmonic stenosis. Aorta: The aortic root and ascending aorta are structurally normal, with no evidence of dilitation. IAS/Shunts: No atrial level shunt detected by color flow Doppler.  LEFT VENTRICLE PLAX 2D LVIDd:         4.60 cm   Diastology LVIDs:         2.80 cm   LV e' medial:    6.09 cm/s LV PW:         1.10 cm   LV E/e' medial:  12.3 LV IVS:        0.90 cm   LV e' lateral:   9.36 cm/s LVOT diam:     2.20 cm   LV E/e' lateral: 8.0 LV SV:         57 LV SV Index:   29 LVOT Area:     3.80 cm                           3D Volume EF:                          3D EF:        58 %                          LV EDV:  143 ml                          LV ESV:       60 ml                          LV SV:        83 ml RIGHT VENTRICLE             IVC RV Basal diam:  2.80 cm     IVC diam: 1.50 cm RV Mid diam:    2.70 cm RV S prime:     12.90 cm/s  TAPSE (M-mode): 2.1 cm LEFT ATRIUM           Index        RIGHT ATRIUM           Index LA diam:      4.10 cm 2.07 cm/m   RA Area:     13.90 cm LA Vol (A2C): 43.8 ml 22.06 ml/m  RA Volume:   28.80 ml  14.51 ml/m LA Vol (A4C): 47.4 ml 23.87 ml/m  AORTIC VALVE LVOT Vmax:   81.80 cm/s LVOT Vmean:  54.400 cm/s LVOT VTI:    0.150 m  AORTA Ao Root diam: 3.30 cm Ao Asc diam:  3.30 cm MITRAL VALVE               TRICUSPID VALVE MV Area (PHT): 2.80 cm    TR Peak grad:   18.8 mmHg MV Decel Time: 271 msec    TR Vmax:        217.00 cm/s MV E velocity: 74.90 cm/s MV A velocity: 86.50 cm/s  SHUNTS MV E/A ratio:  0.87        Systemic VTI:  0.15 m                            Systemic Diam: 2.20 cm Dietrich Pates MD Electronically signed by Dietrich Pates MD Signature Date/Time: 09/07/2023/1:34:17 PM    Final     Microbiology: No results found for this or any previous visit (from the past 240 hour(s)).   Labs: Basic Metabolic Panel: Recent Labs  Lab 09/19/23 1533 09/20/23 0320  NA 140 140  K 3.9 4.3  CL 106 108  CO2 26 23  GLUCOSE 104* 94  BUN 14 17  CREATININE 1.07 0.93  CALCIUM 8.9 8.8*   Liver Function Tests: Recent Labs  Lab 09/19/23 1533 09/20/23 0320  AST 23 30  ALT 32 37  ALKPHOS 104 99  BILITOT 0.6 0.5  PROT 6.8 6.7  ALBUMIN 3.4* 3.4*   No results for input(s): "LIPASE", "AMYLASE" in the last 168 hours. No results for input(s): "AMMONIA" in the last 168 hours. CBC: Recent Labs  Lab 09/19/23 1402 09/20/23 0320  WBC 7.6 8.0  NEUTROABS 4.0  --   HGB 15.7 14.7  HCT 49.8 45.1  MCV 93.6 93.8  PLT 267 288   Cardiac Enzymes: No results for input(s): "CKTOTAL", "CKMB", "CKMBINDEX", "TROPONINI" in the last 168 hours. BNP: BNP (last 3 results) No results for input(s): "BNP" in the last 8760 hours.  ProBNP (last 3 results) No results for input(s): "PROBNP" in the last 8760 hours.  CBG: Recent Labs  Lab 09/19/23 1407  GLUCAP 126*       Signed:  Rhetta Mura MD   [facility] 09/21/2023,  11:29 AM

## 2023-09-21 NOTE — Plan of Care (Signed)
  Problem: Education: Goal: Knowledge of General Education information will improve Description: Including pain rating scale, medication(s)/side effects and non-pharmacologic comfort measures Outcome: Progressing   Problem: Health Behavior/Discharge Planning: Goal: Ability to manage health-related needs will improve Outcome: Progressing   Problem: Clinical Measurements: Goal: Ability to maintain clinical measurements within normal limits will improve Outcome: Progressing Goal: Cardiovascular complication will be avoided Outcome: Progressing   Problem: Activity: Goal: Risk for activity intolerance will decrease Outcome: Progressing   Problem: Nutrition: Goal: Adequate nutrition will be maintained Outcome: Progressing   Problem: Elimination: Goal: Will not experience complications related to bowel motility Outcome: Progressing Goal: Will not experience complications related to urinary retention Outcome: Progressing   Problem: Safety: Goal: Ability to remain free from injury will improve Outcome: Progressing

## 2023-09-21 NOTE — TOC Transition Note (Signed)
Transition of Care [facility]) - CM/SW Discharge Note   Patient Details  Name: Robert Horne MRN: [id number] Date of Birth: 1941/07/28  Transition of Care [facility] CM/SW Contact:  Kermit Balo, RN Phone Number: 09/21/2023, 11:48 AM   Clinical Narrative:     Patient is from [facility] ILF. He did well enough with therapies that pt and daughter want him to return to ILF.  [facility] uses [facility] for their ILF therapies. CM has arranged HH PT/OT with [facility]. Information on the AVS. [facility] will contact him for the first appointment. Pt has needed DME.  Daughter will provide transport home.  Final next level of care: Home w Home Health Services Barriers to Discharge: No Barriers Identified   Patient Goals and CMS Choice CMS Medicare.gov Compare Post Acute Care list provided to:: Patient Represenative (must comment) Choice offered to / list presented to : Adult Children  Discharge Placement                         Discharge Plan and Services Additional resources added to the After Visit Summary for                            [facility] Arranged: PT, OT HH Agency: [facility]) Date HH Agency Contacted: 09/21/23   Representative spoke with at [facility] Agency: Eber Jones  Social Determinants of Health (SDOH) Interventions SDOH Screenings   Food Insecurity: No Food Insecurity (09/19/2023)  Housing: Low Risk  (09/19/2023)  Transportation Needs: No Transportation Needs (09/19/2023)  Utilities: Not At Risk (09/19/2023)  Depression (PHQ2-9): Low Risk  (05/19/2023)  Tobacco Use: Medium Risk (09/19/2023)     Readmission Risk Interventions     No data to display

## 2023-09-21 NOTE — Evaluation (Signed)
Physical Therapy Evaluation  Patient Details Name: Robert Horne MRN: [id number] DOB: Mar 31, 1941 Today's Date: 09/21/2023  History of Present Illness  Robert Horne 82 y.o. male who presented to the ED after several falls over the last couple months. CT head shows a very small left sided hygroma it appears, non-op. PMHx: PD, cancer, hypothyroidism   Clinical Impression  Pt admitted with above diagnosis. Pt currently with functional limitations due to the deficits listed below (see PT Problem List). At the time of PT eval pt was able to perform transfers and ambulation with gross modified independence to supervision for safety and no AD. Pt and daughter both report pt appears at/near baseline of function. Pt practiced mobility with a RW as well as without, and there was no significant improvement in gait with the RW. Pt prefers to walk without it. We simulated pt feeding his dog, Minnie, by placing a weighted container on the floor and picking it back up from standing. No LOB noted. Recommend post-acute therapy at [facility] to maximize safety, functional independence, and for higher level balance training. Acutely, pt will benefit from acute skilled PT to increase their independence and safety with mobility to allow discharge.           If plan is discharge home, recommend the following: A little help with walking and/or transfers;A little help with bathing/dressing/bathroom;Assistance with cooking/housework;Assist for transportation   Can travel by private vehicle        Equipment Recommendations None recommended by PT  Recommendations for Other Services       Functional Status Assessment Patient has had a recent decline in their functional status and demonstrates the ability to make significant improvements in function in a reasonable and predictable amount of time.     Precautions / Restrictions Precautions Precautions: Fall Restrictions Weight Bearing Restrictions: No       Mobility  Bed Mobility Overal bed mobility: Modified Independent Bed Mobility: Supine to Sit, Sit to Supine           General bed mobility comments: HOB flat and rails lowered to simulate home environment. No assist required however increased time taken.    Transfers Overall transfer level: Modified independent Equipment used: None Transfers: Sit to/from Stand Sit to Stand: Supervision, Modified independent (Device/Increase time)           General transfer comment: Progressed to mod I by end of session. Pt practiced both with and without RW without a difference in performance.    Ambulation/Gait Ambulation/Gait assistance: Supervision Gait Distance (Feet): 300 Feet Assistive device: Rolling walker (2 wheels), None Gait Pattern/deviations: Step-through pattern, Decreased stride length, Trunk flexed Gait velocity: Decreased Gait velocity interpretation: 1.31 - 2.62 ft/sec, indicative of limited community ambulator   General Gait Details: Pt ambulated an extended distance without reports of fatigue or any noted worsening of balance or unsteadiness. Pt attempted both with and without RW and pt reports feeling comfortable without an AD. Daughter present and reports pt appears at/near baseline.  Stairs            Wheelchair Mobility     Tilt Bed    Modified Rankin (Stroke Patients Only) Modified Rankin (Stroke Patients Only) Pre-Morbid Rankin Score: No significant disability Modified Rankin: Slight disability     Balance Overall balance assessment: Needs assistance Sitting-balance support: Feet supported Sitting balance-Leahy Scale: Good     Standing balance support: No upper extremity supported, During functional activity Standing balance-Leahy Scale: Fair  Pertinent Vitals/Pain Pain Assessment Pain Assessment: No/denies pain    Home Living Family/patient expects to be discharged to:: Private residence (ILF  - [place]) Living Arrangements: Alone Available Help at Discharge: Friend(s);Neighbor;Available PRN/intermittently Type of Home: House Home Access: Level entry       Home Layout: One level Home Equipment: Cane - single point;Grab bars - tub/shower;Grab bars - toilet (Emergency assist pendant) Additional Comments: has an indoor dog, Minnie    Prior Function Prior Level of Function : Independent/Modified Independent;History of Falls (last six months)             Mobility Comments: no AD, 3 falls in the past week. Pt uses golf cart to get to dining hall at times ADLs Comments: indep for ADLs, cares for dog Minnie. Cleaners come in 1x/wk     Extremity/Trunk Assessment   Upper Extremity Assessment Upper Extremity Assessment: Defer to OT evaluation    Lower Extremity Assessment Lower Extremity Assessment: RLE deficits/detail RLE Deficits / Details: Bilaterally, pt with tight hamstrings and inability to achieve full knee extension in sitting. Bilaterally, 5/5 strength in quads, hamstrings. 4+/5 for hip flexors, ankle DF. RLE Sensation: WNL (Bilaterally)    Cervical / Trunk Assessment Cervical / Trunk Assessment: Kyphotic (mild, with forward head posture and rounded shoulders. Noted pt with posterior lean with LE MMT due to trunk weakness.)  Communication   Communication Communication: No apparent difficulties  Cognition Arousal: Alert Behavior During Therapy: WFL for tasks assessed/performed Overall Cognitive Status: Within Functional Limits for tasks assessed                                 General Comments: follows all commands, WFL for basic orientation.        General Comments General comments (skin integrity, edema, etc.): Simulated scenario of pt feeding his dog. Pt was able to take a weighted container and squat down to put dish on the floor, and later go over and pick the dish up to bring to the counter. No LOB noted.    Exercises      Assessment/Plan    PT Assessment Patient needs continued PT services  PT Problem List Decreased strength;Decreased activity tolerance;Decreased balance;Decreased mobility;Decreased knowledge of use of DME;Decreased safety awareness;Decreased knowledge of precautions       PT Treatment Interventions DME instruction;Gait training;Stair training;Functional mobility training;Therapeutic activities;Therapeutic exercise;Balance training;Patient/family education    PT Goals (Current goals can be found in the Care Plan section)  Acute Rehab PT Goals Patient Stated Goal: Back to independent living PT Goal Formulation: With patient/family Time For Goal Achievement: 09/28/23 Potential to Achieve Goals: Good    Frequency Min 1X/week     Co-evaluation               AM-PAC PT "6 Clicks" Mobility  Outcome Measure Help needed turning from your back to your side while in a flat bed without using bedrails?: None Help needed moving from lying on your back to sitting on the side of a flat bed without using bedrails?: None Help needed moving to and from a bed to a chair (including a wheelchair)?: None Help needed standing up from a chair using your arms (e.g., wheelchair or bedside chair)?: None Help needed to walk in hospital room?: A Little Help needed climbing 3-5 steps with a railing? : A Little 6 Click Score: 22    End of Session Equipment Utilized During Treatment: Gait belt Activity Tolerance:  Patient tolerated treatment well Patient left: in bed;with call bell/phone within reach;with family/visitor present Nurse Communication: Mobility status PT Visit Diagnosis: Unsteadiness on feet (R26.81);Other symptoms and signs involving the nervous system (R29.898)    Time: 3664-4034 PT Time Calculation (min) (ACUTE ONLY): 26 min   Charges:   PT Evaluation $PT Eval Low Complexity: 1 Low PT Treatments $Gait Training: 8-22 mins PT General Charges $$ ACUTE PT VISIT: 1 Visit          Conni Slipper, PT, DPT Acute Rehabilitation Services Secure Chat Preferred Office: [phone]   Marylynn Pearson 09/21/2023, 11:42 AM

## 2023-09-21 NOTE — Progress Notes (Signed)
EOS: Pt A&Ox4, one person assist to the [facility], unsteady on feet. Severe tremors to whole body, requires assistance w/ feeding & urinal. NSR on monitor. PERRLA, LCTA. Urinary frequency, LBM 10/25 which pt states is usual - hx of constipation and BM's every few days.

## 2023-09-21 NOTE — Evaluation (Signed)
Occupational Therapy Evaluation Patient Details Name: Robert Horne MRN: [id number] DOB: 1941-03-23 Today's Date: 09/21/2023   History of Present Illness Robert Horne 82 y.o. male who presented to the ED after several falls over the last couple months. CT head shows a very small left sided hygroma it appears, non-op. PMHx: PD, cancer, hypothyroidism   Clinical Impression   Robert Horne was evaluated s/p the above admission list. He lives alone at [place], [place] and is typically mod I at baseline, he has had several recent falls. Upon evaluation the pt was limited by generalized weakness, baseline PD tremor, unsteady balance and decreased activity tolerance. Overall he needed CGA for transfers and mobility without AD, anticipate pt's fall risk would decrease with use of AD. Due to the deficits listed below the pt also needs up to CGA for LB ADLs and OOB tasks, and set up A for UB ADLs. Pt will benefit from continued acute OT services and HHOT at current ILF.         If plan is discharge home, recommend the following: A little help with walking and/or transfers;A little help with bathing/dressing/bathroom;Assistance with cooking/housework;Direct supervision/assist for medications management;Direct supervision/assist for financial management;Assist for transportation    Functional Status Assessment  Patient has had a recent decline in their functional status and demonstrates the ability to make significant improvements in function in a reasonable and predictable amount of time.  Equipment Recommendations   (RW vs. SPC)       Precautions / Restrictions Precautions Precautions: Fall Restrictions Weight Bearing Restrictions: No      Mobility Bed Mobility Overal bed mobility: Needs Assistance Bed Mobility: Supine to Sit, Sit to Supine     Supine to sit: Supervision Sit to supine: Supervision   General bed mobility comments: incr time to get OOB bed    Transfers Overall transfer level:  Needs assistance Equipment used: None Transfers: Sit to/from Stand Sit to Stand: Contact guard assist                  Balance Overall balance assessment: Needs assistance Sitting-balance support: Feet supported Sitting balance-Leahy Scale: Good     Standing balance support: No upper extremity supported, During functional activity Standing balance-Leahy Scale: Fair           ADL either performed or assessed with clinical judgement   ADL Overall ADL's : Needs assistance/impaired Eating/Feeding: Independent   Grooming: Contact guard assist;Standing   Upper Body Bathing: Set up;Sitting   Lower Body Bathing: Contact guard assist;Sit to/from stand   Upper Body Dressing : Set up;Sitting   Lower Body Dressing: Contact guard assist;Sit to/from stand   Toilet Transfer: Contact guard assist;Ambulation Statistician Details (indicate cue type and reason): no Ad Toileting- Clothing Manipulation and Hygiene: Modified independent;Sitting/lateral lean       Functional mobility during ADLs: Contact guard assist General ADL Comments: no overt LOB, CGA for safety     Vision Baseline Vision/History: 0 No visual deficits Vision Assessment?: No apparent visual deficits     Perception Perception: Within Functional Limits       Praxis Praxis: WFL       Pertinent Vitals/Pain Pain Assessment Pain Assessment: No/denies pain     Extremity/Trunk Assessment Upper Extremity Assessment Upper Extremity Assessment: Generalized weakness   Lower Extremity Assessment Lower Extremity Assessment: Defer to PT evaluation   Cervical / Trunk Assessment Cervical / Trunk Assessment: Kyphotic (mild)   Communication Communication Communication: No apparent difficulties   Cognition Arousal: Alert Behavior During Therapy:  WFL for tasks assessed/performed Overall Cognitive Status: Within Functional Limits for tasks assessed                 General Comments: follows all  commands, WFL for basic orientation.     General Comments  VSS, daughter present     Home Living Family/patient expects to be discharged to:: Private residence (ILF - [place]) Living Arrangements: Alone Available Help at Discharge: Friend(s);Neighbor;Available PRN/intermittently Type of Home: House Home Access: Level entry     Home Layout: One level     Bathroom Shower/Tub: Producer, television/film/video: Handicapped height     Home Equipment: Cane - single point;Grab bars - tub/shower;Grab bars - toilet   Additional Comments: has an indoor dog      Prior Functioning/Environment Prior Level of Function : Independent/Modified Independent;History of Falls (last six months)             Mobility Comments: no AD, 3 falls in the past week ADLs Comments: indep for ADLs, walks to dining hall for meals. Cleaners come in 1x/wk        OT Problem List: Decreased strength;Decreased range of motion;Decreased activity tolerance;Impaired balance (sitting and/or standing);Decreased safety awareness;Decreased knowledge of use of DME or AE;Decreased knowledge of precautions      OT Treatment/Interventions: Self-care/ADL training;Therapeutic exercise;DME and/or AE instruction;Therapeutic activities;Patient/family education;Balance training    OT Goals(Current goals can be found in the care plan section) Acute Rehab OT Goals Patient Stated Goal: home soon OT Goal Formulation: With patient Potential to Achieve Goals: Good ADL Goals Additional ADL Goal #1: Pt will complete ADLs with mod I Additional ADL Goal #2: Pt will navigate hospital environment with LRAD and no safety concerns to demonstrate reduced risk for falls  OT Frequency: Min 1X/week       AM-PAC OT "6 Clicks" Daily Activity     Outcome Measure Help from another person eating meals?: None Help from another person taking care of personal grooming?: A Little Help from another person toileting, which includes using  toliet, bedpan, or urinal?: A Little Help from another person bathing (including washing, rinsing, drying)?: A Little Help from another person to put on and taking off regular upper body clothing?: A Little Help from another person to put on and taking off regular lower body clothing?: A Little 6 Click Score: 19   End of Session Equipment Utilized During Treatment: Gait belt Nurse Communication: Mobility status  Activity Tolerance: Patient tolerated treatment well Patient left: in bed;with call bell/phone within reach;with bed alarm set;with family/visitor present  OT Visit Diagnosis: Other abnormalities of gait and mobility (R26.89);Muscle weakness (generalized) (M62.81);History of falling (Z91.81);Repeated falls (R29.6);Unsteadiness on feet (R26.81)                Time: 1610-9604 OT Time Calculation (min): 27 min Charges:  OT General Charges $OT Visit: 1 Visit OT Evaluation $OT Eval Moderate Complexity: 1 Mod OT Treatments $Self Care/Home Management : 8-22 mins  Derenda Mis, OTR/L Acute Rehabilitation Services Office [phone] Secure Chat Communication Preferred   Donia Pounds 09/21/2023, 10:57 AM

## 2023-09-21 NOTE — Plan of Care (Signed)
Pt alert and oriented x 4. IV was patent and saline locked. Continent of bowel and bladder. IV removed for discharge. Daughter at bedside. Discharge instructions completed with pt and daughter. All questions and concerns answered. Pt stable upon discharge.

## 2023-09-21 NOTE — Progress Notes (Unsigned)
Enrolled for [employer] to mail a ZIO XT long term holter monitor to the patients address on file.   Letter with instructions mailed to patient.  [name] to read.

## 2023-09-21 NOTE — Progress Notes (Signed)
   09/21/23 0246  Provider Notification  Provider Name/Title Laban Emperor DO  Date Provider Notified 09/21/23  Time Provider Notified 0246  Method of Notification Page  Notification Reason Other (Comment) (Intermittent brady HR, asymptomatic)  Provider response No new orders  Date of Provider Response 09/21/23  Time of Provider Response 0247   NSR on monitor. Pt A&Ox4, intermittently brady, down into 40's, lowest seen at 38, asymptomatic. Does not sustain. Pt has no complaints at this time.

## 2023-09-25 DIAGNOSIS — R001 Bradycardia, unspecified: Secondary | ICD-10-CM

## 2023-09-25 DIAGNOSIS — R55 Syncope and collapse: Secondary | ICD-10-CM | POA: Diagnosis not present

## 2023-09-25 DIAGNOSIS — I440 Atrioventricular block, first degree: Secondary | ICD-10-CM

## 2023-10-05 ENCOUNTER — Telehealth: Payer: Self-pay | Admitting: Nurse Practitioner

## 2023-10-05 NOTE — Telephone Encounter (Signed)
Called patient in regards to complaint of black stools. Patient states he is not ;having black stools, although it was a little darker than normal. Patient states he started out being constipated and took a laxative, nothing happened, then took another laxative and started having diarrhea. Patient continues to deny black colored stools and also states there is no bright red blood noted in his stool. Patient also denied taking Pepto Bismol as well as Iron supplements. States he had diarrhea until Sunday afternoon and states his stools are back to normal at present. Informed the patient if he has any further problems to call back. Paitnet understood and agreed.

## 2023-10-05 NOTE — Telephone Encounter (Signed)
Inbound call from patient, would like to speak with a nurse, states he has been having black stools since Friday and is concerned.

## 2023-10-17 ENCOUNTER — Emergency Department (HOSPITAL_COMMUNITY): Payer: Medicare Other

## 2023-10-17 ENCOUNTER — Encounter (HOSPITAL_COMMUNITY): Payer: Self-pay

## 2023-10-17 ENCOUNTER — Emergency Department (HOSPITAL_COMMUNITY)
Admission: EM | Admit: 2023-10-17 | Discharge: 2023-10-17 | Disposition: A | Discharge status: Home / Self Care | Payer: Medicare Other | Attending: Emergency Medicine | Admitting: Emergency Medicine

## 2023-10-17 ENCOUNTER — Other Ambulatory Visit: Payer: Self-pay

## 2023-10-17 DIAGNOSIS — Z8546 Personal history of malignant neoplasm of prostate: Secondary | ICD-10-CM | POA: Insufficient documentation

## 2023-10-17 DIAGNOSIS — R531 Weakness: Secondary | ICD-10-CM | POA: Diagnosis present

## 2023-10-17 DIAGNOSIS — Z79899 Other long term (current) drug therapy: Secondary | ICD-10-CM | POA: Insufficient documentation

## 2023-10-17 DIAGNOSIS — Z8673 Personal history of transient ischemic attack (TIA), and cerebral infarction without residual deficits: Secondary | ICD-10-CM | POA: Diagnosis not present

## 2023-10-17 DIAGNOSIS — G20C Parkinsonism, unspecified: Secondary | ICD-10-CM | POA: Insufficient documentation

## 2023-10-17 DIAGNOSIS — E039 Hypothyroidism, unspecified: Secondary | ICD-10-CM | POA: Diagnosis not present

## 2023-10-17 DIAGNOSIS — R42 Dizziness and giddiness: Secondary | ICD-10-CM | POA: Diagnosis not present

## 2023-10-17 LAB — CBC WITH DIFFERENTIAL/PLATELET
Abs Immature Granulocytes: 0.02 10*3/uL (ref 0.00–0.07)
Basophils Absolute: 0.1 10*3/uL (ref 0.0–0.1)
Basophils Relative: 1 %
Eosinophils Absolute: 0.1 10*3/uL (ref 0.0–0.5)
Eosinophils Relative: 1 %
HCT: 45.5 % (ref 39.0–52.0)
Hemoglobin: 14.9 g/dL (ref 13.0–17.0)
Immature Granulocytes: 0 %
Lymphocytes Relative: 27 %
Lymphs Abs: 1.9 10*3/uL (ref 0.7–4.0)
MCH: 30.2 pg (ref 26.0–34.0)
MCHC: 32.7 g/dL (ref 30.0–36.0)
MCV: 92.1 fL (ref 80.0–100.0)
Monocytes Absolute: 0.8 10*3/uL (ref 0.1–1.0)
Monocytes Relative: 11 %
Neutro Abs: 4.2 10*3/uL (ref 1.7–7.7)
Neutrophils Relative %: 60 %
Platelets: 321 10*3/uL (ref 150–400)
RBC: 4.94 MIL/uL (ref 4.22–5.81)
RDW: 12.9 % (ref 11.5–15.5)
WBC: 7 10*3/uL (ref 4.0–10.5)
nRBC: 0 % (ref 0.0–0.2)

## 2023-10-17 LAB — COMPREHENSIVE METABOLIC PANEL
ALT: 14 U/L (ref 0–44)
AST: 24 U/L (ref 15–41)
Albumin: 3.9 g/dL (ref 3.5–5.0)
Alkaline Phosphatase: 106 U/L (ref 38–126)
Anion gap: 9 (ref 5–15)
BUN: 13 mg/dL (ref 8–23)
CO2: 24 mmol/L (ref 22–32)
Calcium: 9.1 mg/dL (ref 8.9–10.3)
Chloride: 104 mmol/L (ref 98–111)
Creatinine, Ser: 1 mg/dL (ref 0.61–1.24)
GFR, Estimated: >60 mL/min (ref >60)
Glucose, Bld: 115 mg/dL — ABNORMAL HIGH (ref 70–99)
Potassium: 4.2 mmol/L (ref 3.5–5.1)
Sodium: 137 mmol/L (ref 135–145)
Total Bilirubin: 0.4 mg/dL (ref <1.2)
Total Protein: 7.2 g/dL (ref 6.5–8.1)

## 2023-10-17 LAB — TROPONIN I (HIGH SENSITIVITY): Troponin I (High Sensitivity): 7 ng/L (ref <18)

## 2023-10-17 LAB — TSH: TSH: 4.236 u[IU]/mL (ref 0.350–4.500)

## 2023-10-17 LAB — MAGNESIUM: Magnesium: 2.1 mg/dL (ref 1.7–2.4)

## 2023-10-17 NOTE — Discharge Instructions (Addendum)
You were seen here today for weakness and lightheadedness.  Your workup showed normal electrolytes, kidney function, cell counts, thyroid test, and no evidence of heart attack on your blood work or EKG.  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  Please use medications as previously prescribed.  Please follow-up with your PCP.  Please return to the emergency department for chest pain, shortness of breath, severe vomiting or inability to keep down food, loss of consciousness, or any worsening symptom or concern.

## 2023-10-17 NOTE — ED Notes (Signed)
Daughter Lanora Manis [phone] would like an update asap

## 2023-10-17 NOTE — ED Triage Notes (Signed)
Pt arrives via ems from [facility] for the c/o near syncope / dizzyness that lasted around . No stroke symptoms on arrival. Pt just finished wearing holter monitor therapy. EKG NSR per ems. Alert and oriented x 4.   EMS VS  158/84 143bg 100% 02 RA 70p

## 2023-10-17 NOTE — ED Provider Notes (Signed)
[place] EMERGENCY DEPARTMENT AT [facility] Provider Note   CSN: [id number] Arrival date & time: 10/17/23  1845     History  Chief Complaint  Patient presents with   Near Syncope    Robert Horne is a 82 y.o. male with a PMH of Parkinson's disease, hypothyroid, A-fib, prostate cancer, prior TIA, subdural hematoma who presented to the ED for lightheadedness.  Patient reports this morning, he had an episode of lightheadedness while sitting at the breakfast table that lasted somewhere between 10-30 minutes.  Denies a history of similar prior episodes.  He reports he had associated generalized weakness during this time and was unable to bring his spoon to his mouth.  He denies 1 side being weaker than the other.  He reports he recently wore a heart monitor and was told the rhythm was okay on it.  Denies chest pain, shortness of breath, or syncopal episode.  States he has been eating well and denies nausea, vomiting, or diarrhea.  Reports he has been ambulating without difficulty at home.  HPI     Home Medications Prior to Admission medications   Medication Sig Start Date End Date Taking? Authorizing Provider  ALPRAZolam Prudy Feeler) 1 MG tablet Take 1 tablet (1 mg total) by mouth daily as needed for anxiety. 09/21/23   Rhetta Mura, MD  buPROPion (WELLBUTRIN XL) 300 MG 24 hr tablet Take 1 tablet (300 mg total) by mouth daily. 09/22/23   Rhetta Mura, MD  Carbidopa-Levodopa ER (SINEMET CR) 25-100 MG tablet controlled release TAKE 1 TABLET BY MOUTH AT 7 IN THE MORNING THEN TAKE 1 TABLET BY MOUTH AT NOON, THEN TAKE 1 TABLET BY MOUTH AT 5 IN THE EVENING 09/17/23   [name], MD  Cholecalciferol (DIALYVITE VITAMIN D 5000) 125 MCG (5000 UT) capsule Take 1 capsule (5,000 Units total) by mouth daily. 09/21/23   Rhetta Mura, MD  esomeprazole (NEXIUM) 20 MG capsule Take 20 mg by mouth daily at 12 noon.    [provider]  levothyroxine (SYNTHROID) 75 MCG  tablet TAKE 1 TABLET BY MOUTH DAILY BEFORE BREAKFAST 08/10/23   [name], MD  mirtazapine (REMERON) 45 MG tablet Take 1 tablet (45 mg total) by mouth at bedtime. 09/21/23   Rhetta Mura, MD  nitroGLYCERIN (NITROSTAT) 0.4 MG SL tablet Place 1 tablet (0.4 mg total) under the tongue every 5 (five) minutes as needed for chest pain. 08/25/22 10/31/23  [name], [name], MD  rosuvastatin (CRESTOR) 40 MG tablet Take 40 mg by mouth daily.    [provider]      Allergies    Patient has no known allergies.    Review of Systems   Review of Systems  Physical Exam Updated Vital Signs BP (!) 143/69 (BP Location: Right Arm)   Pulse 73   Temp 98.4 F (36.9 C) (Oral)   Resp 20   SpO2 100%  Physical Exam Constitutional:      General: He is not in acute distress.    Appearance: Normal appearance. He is not ill-appearing.  HENT:     Head: Normocephalic and atraumatic.     Nose: Nose normal.     Mouth/Throat:     Mouth: Mucous membranes are moist.     Pharynx: Oropharynx is clear.  Eyes:     Pupils: Pupils are equal, round, and reactive to light.  Cardiovascular:     Rate and Rhythm: Normal rate and regular rhythm.     Heart sounds: Normal heart sounds. No  murmur heard.    No friction rub. No gallop.  Pulmonary:     Effort: Pulmonary effort is normal.     Breath sounds: Normal breath sounds. No stridor. No wheezing, rhonchi or rales.  Abdominal:     Palpations: Abdomen is soft.     Tenderness: There is no abdominal tenderness. There is no guarding or rebound.  Musculoskeletal:     Cervical back: Normal range of motion and neck supple.     Right lower leg: No edema.     Left lower leg: No edema.  Skin:    General: Skin is warm and dry.     Capillary Refill: Capillary refill takes less than 2 seconds.  Neurological:     General: No focal deficit present.     Mental Status: He is alert.     Comments: Cranial nerves II-XII intact.  Strength 5/5 and sensation intact in  all 4 extremities.  Finger-to-nose with bilateral tremor but no dysmetria     ED Results / Procedures / Treatments   Labs (all labs ordered are listed, but only abnormal results are displayed) Labs Reviewed  COMPREHENSIVE METABOLIC PANEL - Abnormal; Notable for the following components:      Result Value   Glucose, Bld 115 (*)    All other components within normal limits  CBC WITH DIFFERENTIAL/PLATELET  MAGNESIUM  TSH  TROPONIN I (HIGH SENSITIVITY)    EKG None  Radiology CT Head Wo Contrast  Result Date: 10/17/2023 CLINICAL DATA:  Subdural hematoma EXAM: CT HEAD WITHOUT CONTRAST TECHNIQUE: Contiguous axial images were obtained from the base of the skull through the vertex without intravenous contrast. RADIATION DOSE REDUCTION: This exam was performed according to the departmental dose-optimization program which includes automated exposure control, adjustment of the mA and/or kV according to patient size and/or use of iterative reconstruction technique. COMPARISON:  CT head 09/20/2023. FINDINGS: Brain: Low-density left frontal subdural fluid collection is present measuring up to 1 cm in thickness which appears unchanged from the prior study. No hyperdense hemorrhage identified. No significant mass effect or midline shift. No acute cortical infarct identified. There is stable mild periventricular white matter hypodensity, likely chronic small vessel ischemic change. Vascular: Atherosclerotic calcifications are present within the cavernous internal carotid arteries. Skull: Normal. Negative for fracture or focal lesion. Sinuses/Orbits: No acute finding. Other: None. IMPRESSION: 1. Stable low-density left frontal subdural fluid collection measuring up to 1 cm in thickness. No significant mass effect or midline shift. 2. No acute intracranial process. Electronically Signed   By: Darliss Cheney M.D.   On: 10/17/2023 21:11    Procedures Procedures    Medications Ordered in ED Medications - No  data to display  ED Course/ Medical Decision Making/ A&P                                 Medical Decision Making Amount and/or Complexity of Data Reviewed Labs: ordered. Radiology: ordered.   Vital signs stable in the ED.  Physical exam reassuring without focal neurologic deficit.  CBC with no leukocytosis or anemia.  Metabolic panel with no gross metabolic or electrolyte abnormality.  Troponin 7.  TSH WNL.  Magnesium 2.1.  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  He denies having a syncopal episode.  I have low suspicion for ACS, electrolyte abnormality, hypothyroidism.  Additionally, CT head showed stability of his known subdural hemorrhage versus  fluid collection.  Results of laboratory and imaging evaluations were discussed with the patient.  Additionally, I called the daughter, Gennie Alma, and spoke to her via telephone with an update.    Encouraged patient to follow-up with his PCP.  Strict return precautions were discussed, and the patient voiced understanding.  Patient was discharged in stable condition.        Final Clinical Impression(s) / ED Diagnoses Final diagnoses:  Weakness    Rx / DC Orders ED Discharge Orders     None         Janyth Pupa, MD 10/17/23 2347    Charlynne Pander, MD 10/20/23 2056

## 2023-11-10 ENCOUNTER — Ambulatory Visit: Payer: Medicare Other | Admitting: Internal Medicine

## 2023-11-10 VITALS — BP 118/70 | HR 83 | Temp 97.8°F | Resp 18 | Wt 169.0 lb

## 2023-11-10 DIAGNOSIS — E039 Hypothyroidism, unspecified: Secondary | ICD-10-CM

## 2023-11-10 DIAGNOSIS — I4891 Unspecified atrial fibrillation: Secondary | ICD-10-CM

## 2023-11-10 DIAGNOSIS — R300 Dysuria: Secondary | ICD-10-CM | POA: Diagnosis not present

## 2023-11-10 DIAGNOSIS — N390 Urinary tract infection, site not specified: Secondary | ICD-10-CM

## 2023-11-10 DIAGNOSIS — F3342 Major depressive disorder, recurrent, in full remission: Secondary | ICD-10-CM

## 2023-11-10 LAB — POCT URINALYSIS DIPSTICK
Bilirubin, UA: NEGATIVE
Blood, UA: NEGATIVE
Glucose, UA: NEGATIVE
Ketones, UA: POSITIVE
Leukocytes, UA: NEGATIVE
Nitrite, UA: NEGATIVE
Protein, UA: POSITIVE — AB
Spec Grav, UA: >=1.03 — AB (ref 1.010–1.025)
Urobilinogen, UA: 0.2 U/dL
pH, UA: 5 (ref 5.0–8.0)

## 2023-11-10 MED ORDER — GEMTESA 75 MG PO TABS: 1.0000 | ORAL_TABLET | Freq: Every day | ORAL | 3 refills | Status: AC

## 2023-11-10 NOTE — Assessment & Plan Note (Addendum)
His UA showed no nitrite, no leukocyte but specific gravity is 1.030. He will drink plenty of water and will start gemtesa.

## 2023-11-10 NOTE — Progress Notes (Signed)
   Office Visit  Subjective   Patient ID: Robert Horne   DOB: 23-Nov-1941   Age: 82 y.o.   MRN: [id number]   Chief Complaint Chief Complaint  Patient presents with   Follow-up    3 month follow up     History of Present Illness 82 years old male is here for follow up.  He is complaining of increased urinary frequency and burning urination for the last few weeks.  He said that it does not burn all the time.  No fever or chills.  He could not give [place] urine sample today.    He was admitted to [facility] at the end of October when he was noted to have a subdural hematoma and again went to the emergency room on November 26 for feeling weak.  He said that he has subdural hematoma for a long time.    He has dyslipidemia and take rosuvastatin 40 mg daily without any side effect.  He has lipid panel done in July and his LDL was target control. . He has brief episode of AF but he is not on any anticoagulation.    He has hypothyroidism and he takes levothyroxine and his TSH in 9/24 was therapeutic.  He has anxiety and he occasionally takes xanax may be once every 2 weeks.  He has parkinsonism and his next appointment will be in April. He takes Sinemet three time a day.   Past Medical History Past Medical History:  Diagnosis Date   [facility])    prostate, 2002   H/O heart artery stent    Hypothyroidism      Allergies No Known Allergies   Review of Systems Review of Systems  Constitutional: Negative.   HENT: Negative.    Respiratory: Negative.    Cardiovascular: Negative.   Gastrointestinal: Negative.   Genitourinary:  Positive for dysuria and frequency.  Neurological: Negative.        Objective:    Vitals BP 118/70 (BP Location: Left Arm, Patient Position: Sitting, Cuff Size: Normal)   Pulse 83   Temp 97.8 F (36.6 C)   Resp 18   Wt 169 lb (76.7 kg)   SpO2 97%   BMI 23.57 kg/m    Physical Examination Physical Exam Constitutional:      Appearance: Normal  appearance.  HENT:     Head: Normocephalic and atraumatic.  Cardiovascular:     Rate and Rhythm: Normal rate and regular rhythm.     Heart sounds: Normal heart sounds.  Pulmonary:     Effort: Pulmonary effort is normal.     Breath sounds: Normal breath sounds.  Abdominal:     Palpations: Abdomen is soft.  Neurological:     Mental Status: He is alert and oriented to person, place, and time.        Assessment & Plan:   Atrial fibrillation with rapid ventricular response (HCC) His rate is controlled.  Hypothyroidism stable  Depression Better  Dysuria He will bring [place] urine sample on Thursday.     Return in about 3 months (around 02/08/2024).   [name], MD

## 2023-11-10 NOTE — Assessment & Plan Note (Signed)
stable °

## 2023-11-10 NOTE — Addendum Note (Signed)
Addended byEloisa Northern on: 11/10/2023 04:25 PM   Modules accepted: Orders

## 2023-11-10 NOTE — Assessment & Plan Note (Signed)
His rate is controlled. 

## 2023-11-10 NOTE — Assessment & Plan Note (Signed)
Better  

## 2023-11-22 IMAGING — CT CT ABD-PELV W/ CM
2 of 5 series · 16 of 46 positions shown, 18 images · IV contrast (APPLIED)
Comparison: None.

CLINICAL DATA: Unintentional weight loss. Remote history of
prostate cancer. Diffuse abdominal pain.

EXAM:
CT ABDOMEN AND PELVIS WITH CONTRAST
TECHNIQUE: Multidetector CT imaging of the abdomen and pelvis was performed
using the standard protocol following bolus administration of
intravenous contrast.

[Series 2: abd pel w · axial · 0.74mm/px · z∈[+780,+1206]mm · 13 of 96 slices shown, 15 images]
[im 6/96  soft-tissue]
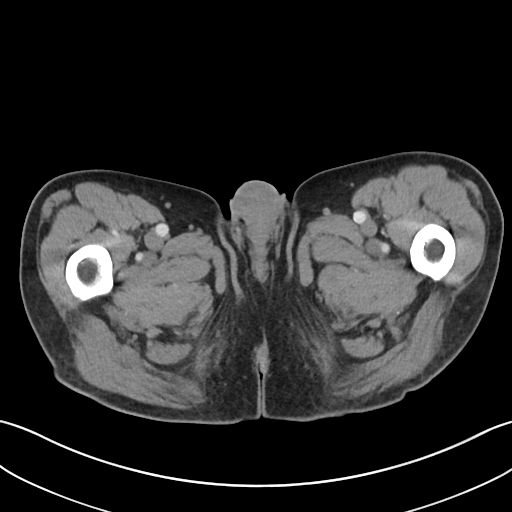
[im 6/96  bone]
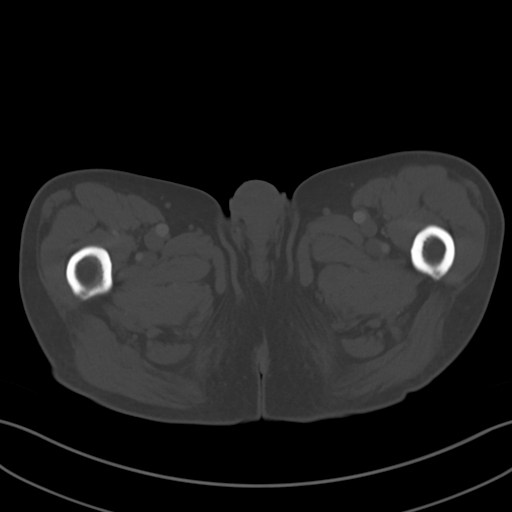
[im 16/96  soft-tissue]
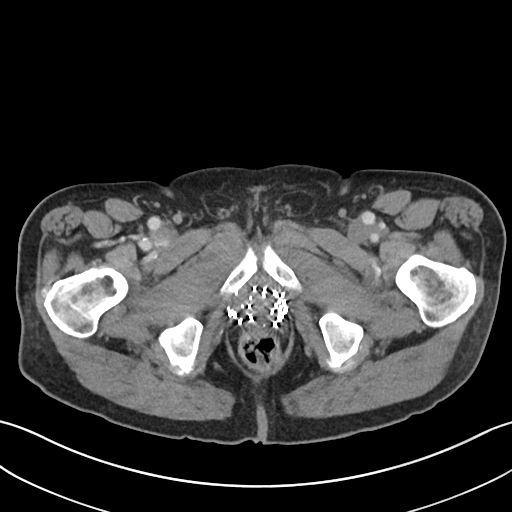
[im 21/96  soft-tissue]
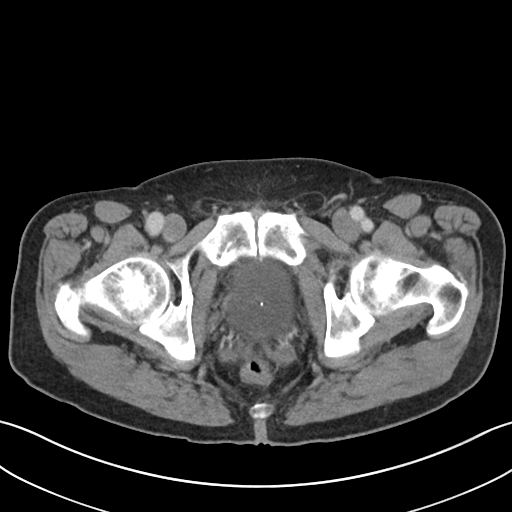
[im 26/96  soft-tissue]
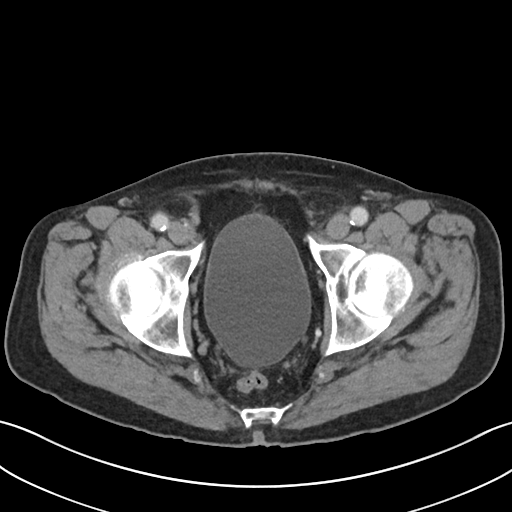
[im 36/96  soft-tissue]
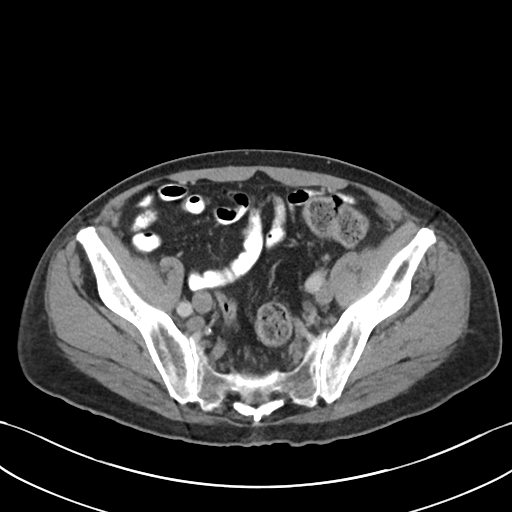
[im 41/96  soft-tissue]
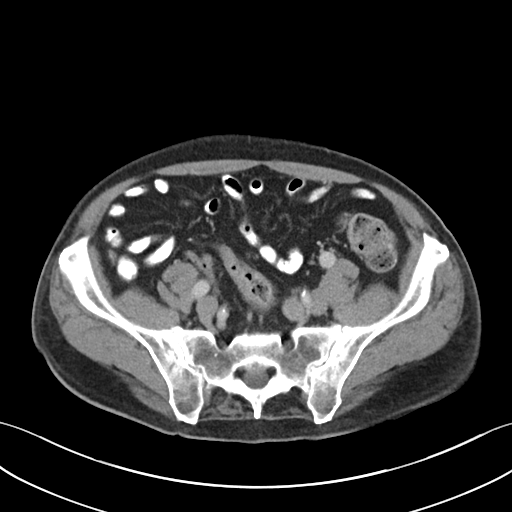
[im 51/96  soft-tissue]
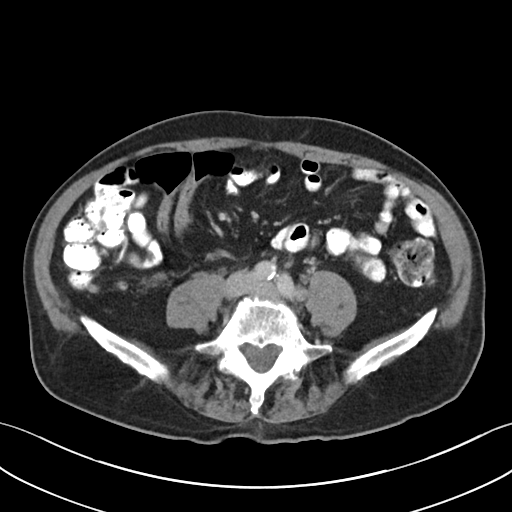
[im 56/96  soft-tissue]
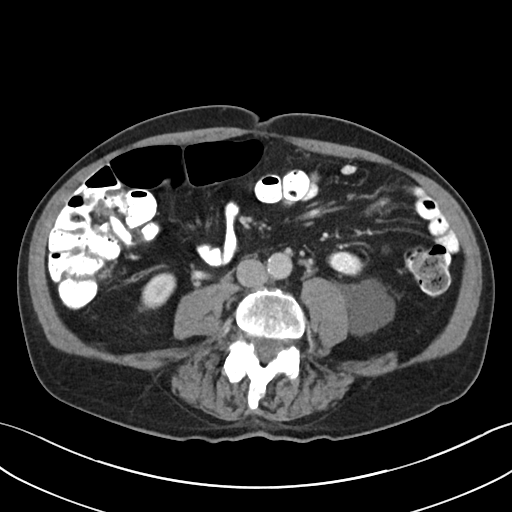
[im 61/96  soft-tissue]
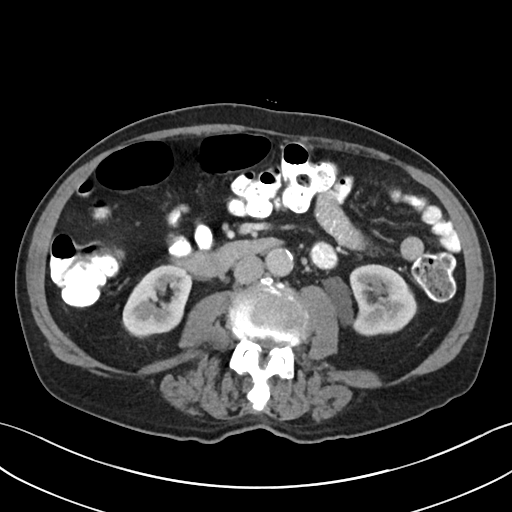
[im 61/96  bone]
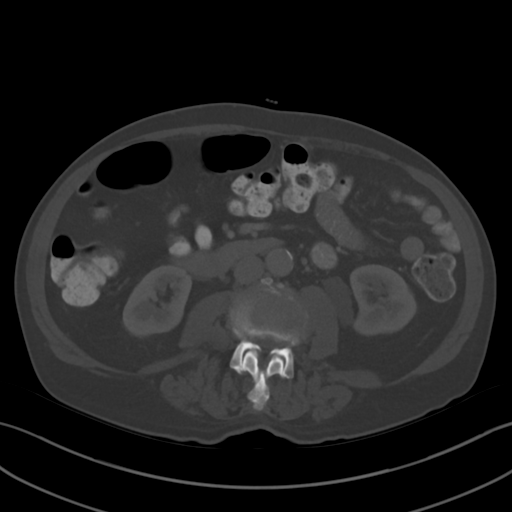
[im 71/96  soft-tissue]
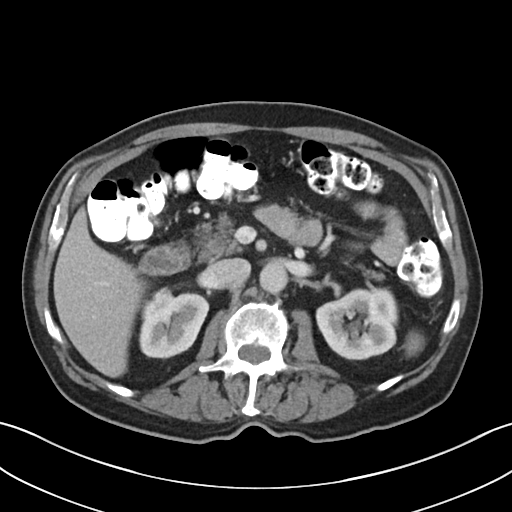
[im 76/96  soft-tissue]
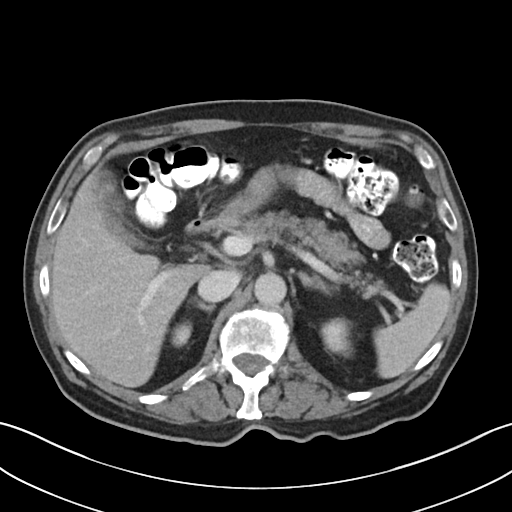
[im 81/96  soft-tissue]
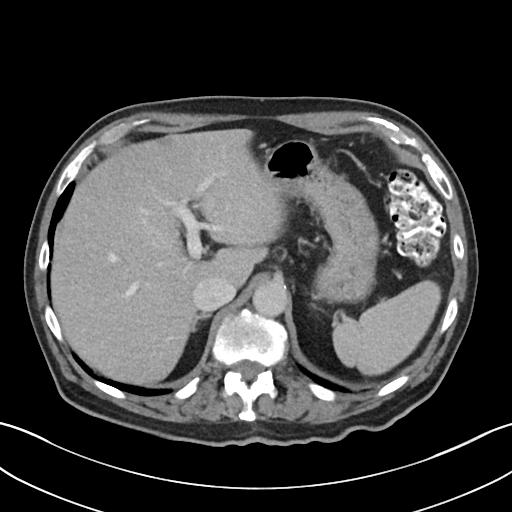
[im 91/96  soft-tissue]
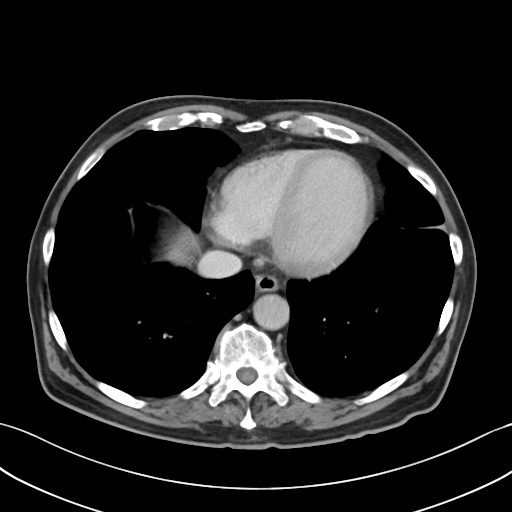

[Series 5: coronal · coronal · 0.73mm/px · 3 of 89 slices shown]
[im 30/89  soft-tissue]
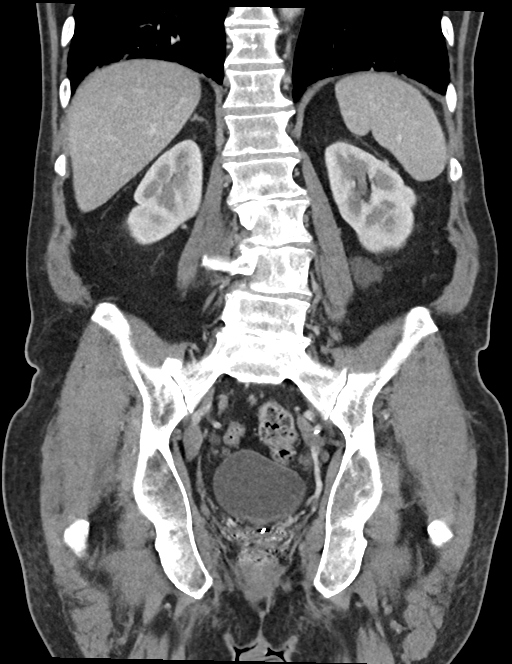
[im 40/89  soft-tissue]
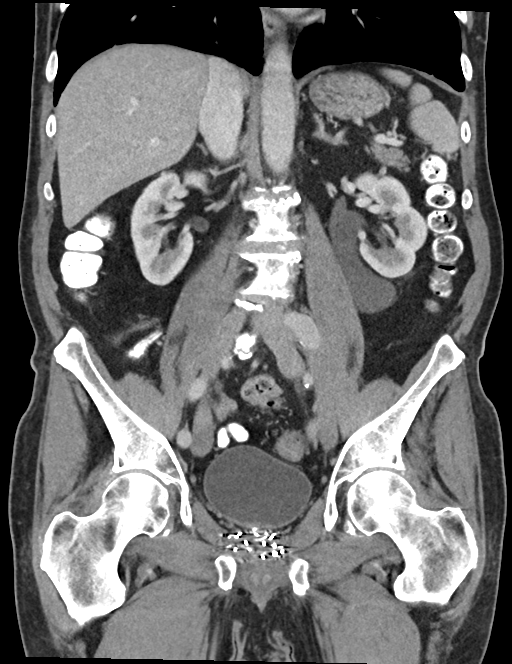
[im 49/89  soft-tissue]
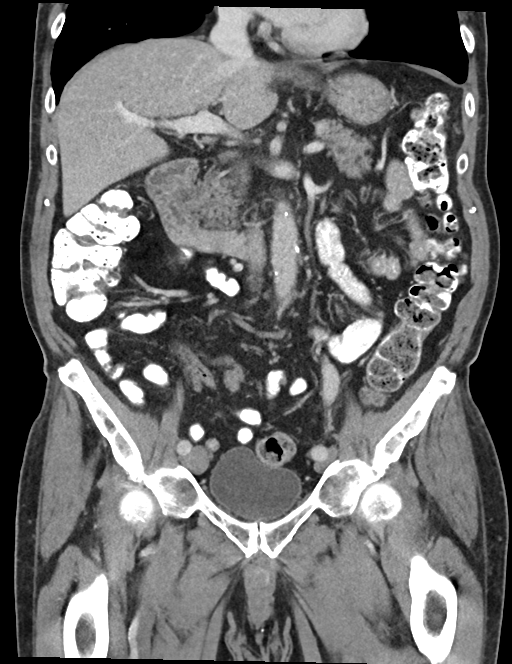

[16 of 46 positions shown; findings below may reference images not displayed]

RADIATION DOSE REDUCTION: This exam was performed according to the
departmental dose-optimization program which includes automated
exposure control, adjustment of the mA and/or kV according to
patient size and/or use of iterative reconstruction technique.

CONTRAST:  75mL OMNIPAQUE IOHEXOL 300 MG/ML  SOLN
FINDINGS: Lower chest: No acute abnormality.

Hepatobiliary: No focal hepatic abnormality. Gallbladder
unremarkable.

Pancreas: No focal abnormality or ductal dilatation.

Spleen: No focal abnormality.  Normal size.

Adrenals/Urinary Tract: Left renal pelvis is patulous and extends
below the left kidney. Calices are nondilated as is the ureter. This
may reflect extrarenal pelvis or changes from prior obstruction. No
current evidence of obstruction. No renal or adrenal mass. Urinary
bladder unremarkable.

Stomach/Bowel: Normal appendix. Stomach, large and small bowel
grossly unremarkable.

Vascular/Lymphatic: Scattered aortic atherosclerosis. No evidence of
aneurysm or adenopathy.

Reproductive: Radiation seeds in the prostate.  No visible mass.

Other: No free fluid or free air.

Musculoskeletal: No acute bony abnormality.
IMPRESSION: No acute findings in the abdomen or pelvis.

Aortic atherosclerosis.

## 2023-12-03 ENCOUNTER — Emergency Department (HOSPITAL_BASED_OUTPATIENT_CLINIC_OR_DEPARTMENT_OTHER): Payer: Medicare Other | Admitting: Radiology

## 2023-12-03 ENCOUNTER — Other Ambulatory Visit: Payer: Self-pay

## 2023-12-03 ENCOUNTER — Emergency Department (HOSPITAL_BASED_OUTPATIENT_CLINIC_OR_DEPARTMENT_OTHER)
Admission: EM | Admit: 2023-12-03 | Discharge: 2023-12-03 | Disposition: A | Discharge status: Home / Self Care | Payer: Medicare Other | Attending: Emergency Medicine | Admitting: Emergency Medicine

## 2023-12-03 DIAGNOSIS — Z79899 Other long term (current) drug therapy: Secondary | ICD-10-CM | POA: Insufficient documentation

## 2023-12-03 DIAGNOSIS — M25551 Pain in right hip: Secondary | ICD-10-CM | POA: Diagnosis present

## 2023-12-03 DIAGNOSIS — R001 Bradycardia, unspecified: Secondary | ICD-10-CM

## 2023-12-03 DIAGNOSIS — I2510 Atherosclerotic heart disease of native coronary artery without angina pectoris: Secondary | ICD-10-CM | POA: Insufficient documentation

## 2023-12-03 MED ORDER — METHYLPREDNISOLONE 4 MG PO TBPK: ORAL_TABLET | ORAL | 0 refills | Status: AC

## 2023-12-03 MED ORDER — NAPROXEN 500 MG PO TABS: 500.0000 mg | ORAL_TABLET | Freq: Two times a day (BID) | ORAL | 0 refills | Status: DC

## 2023-12-03 MED ORDER — PREDNISONE 50 MG PO TABS
60.0000 mg | ORAL_TABLET | Freq: Once | ORAL | Status: AC
  Administered 2023-12-03: 60 mg via ORAL
  Filled 2023-12-03: qty 1

## 2023-12-03 MED ORDER — LIDOCAINE 5 % EX PTCH
1.0000 | MEDICATED_PATCH | Freq: Once | CUTANEOUS | Status: DC
  Administered 2023-12-03: 1 via TRANSDERMAL
  Filled 2023-12-03: qty 1

## 2023-12-03 MED ORDER — IBUPROFEN 400 MG PO TABS
600.0000 mg | ORAL_TABLET | Freq: Once | ORAL | Status: AC
  Administered 2023-12-03: 600 mg via ORAL
  Filled 2023-12-03: qty 1

## 2023-12-03 NOTE — ED Provider Notes (Signed)
 [place] EMERGENCY DEPARTMENT AT [facility] Provider Note   CSN: [id number] Arrival date & time: 12/03/23  0913     History  Chief Complaint  Patient presents with  . Hip Pain    Robert Horne is a 83 y.o. male.  Patient is an 83 year old male with a past medical history of paroxysmal A-fib not on [facility], prior SDH, CAD presenting to the emergency department with right hip pain.  Patient states for the last 3 to 4 days he has been having pain in his right hip.  He states it is worsening each day.  The pain is worse when he goes from sitting to standing and bends his knee.  He states he has had no trauma or falls, no heavy lifting.  He states that he has been in physical therapy and is unsure if that may have exacerbated it.  He denies any numbness or weakness.  The history is provided by the patient.  Hip Pain       Home Medications Prior to Admission medications   Medication Sig Start Date End Date Taking? Authorizing Provider  methylPREDNISolone  (MEDROL  DOSEPAK) 4 MG TBPK tablet Take 6 tablets (24 mg total) by mouth Nightly for 1 day, THEN 5 tablets (20 mg total) Nightly for 1 day, THEN 4 tablets (16 mg total) Nightly for 1 day, THEN 3 tablets (12 mg total) Nightly for 1 day, THEN 2 tablets (8 mg total) Nightly for 1 day, THEN 1 tablet (4 mg total) Nightly for 1 day. 12/03/23 12/09/23 Yes Ellouise, Dyani Babel K, DO  naproxen  (NAPROSYN ) 500 MG tablet Take 1 tablet (500 mg total) by mouth 2 (two) times daily. 12/03/23  Yes Ellouise, Noreene Boreman K, DO  ALPRAZolam  (XANAX ) 1 MG tablet Take 1 tablet (1 mg total) by mouth daily as needed for anxiety. 09/21/23   Samtani, Jai-Gurmukh, MD  buPROPion  (WELLBUTRIN  XL) 300 MG 24 hr tablet Take 1 tablet (300 mg total) by mouth daily. 09/22/23   Samtani, Jai-Gurmukh, MD  Carbidopa -Levodopa  ER (SINEMET  CR) 25-100 MG tablet controlled release TAKE 1 TABLET BY MOUTH AT 7 IN THE MORNING THEN TAKE 1 TABLET BY MOUTH AT NOON, THEN TAKE 1 TABLET BY MOUTH AT 5  IN THE EVENING 09/17/23   Buck Saucer, MD  Cholecalciferol  (DIALYVITE  VITAMIN D  5000) 125 MCG (5000 UT) capsule Take 1 capsule (5,000 Units total) by mouth daily. 09/21/23   Samtani, Jai-Gurmukh, MD  esomeprazole  (NEXIUM ) 20 MG capsule Take 20 mg by mouth daily at 12 noon.    [provider]  levothyroxine  (SYNTHROID ) 75 MCG tablet TAKE 1 TABLET BY MOUTH DAILY BEFORE BREAKFAST 08/10/23   Caleen Dirks, MD  mirtazapine  (REMERON ) 45 MG tablet Take 1 tablet (45 mg total) by mouth at bedtime. 09/21/23   Samtani, Jai-Gurmukh, MD  nitroGLYCERIN  (NITROSTAT ) 0.4 MG SL tablet Place 1 tablet (0.4 mg total) under the tongue every 5 (five) minutes as needed for chest pain. 08/25/22 10/31/23  [name], Newman PARAS, MD  rosuvastatin  (CRESTOR ) 40 MG tablet Take 40 mg by mouth daily.    [provider]  Vibegron  (GEMTESA ) 75 MG TABS Take 1 tablet (75 mg total) by mouth daily. 11/10/23   Amin, Saad, MD      Allergies    Patient has no known allergies.    Review of Systems   Review of Systems  Physical Exam Updated Vital Signs BP 128/72 (BP Location: Right Arm)   Pulse 69   Temp 98.1 F (36.7 C) (Temporal)   Resp  16   SpO2 96%  Physical Exam Vitals and nursing note reviewed.  Constitutional:      General: He is not in acute distress.    Appearance: Normal appearance.  HENT:     Head: Normocephalic and atraumatic.     Nose: Nose normal.     Mouth/Throat:     Mouth: Mucous membranes are moist.  Eyes:     Extraocular Movements: Extraocular movements intact.  Cardiovascular:     Rate and Rhythm: Normal rate and regular rhythm.     Pulses: Normal pulses.  Pulmonary:     Effort: Pulmonary effort is normal.  Abdominal:     General: Abdomen is flat.     Palpations: Abdomen is soft.     Tenderness: There is no abdominal tenderness.  Musculoskeletal:        General: Tenderness (Over right greater trochanter) present. Normal range of motion.     Cervical back: Normal range of motion.      Right lower leg: No edema.     Left lower leg: No edema.     Comments: Negative straight leg raise, no pain with logroll on the right  Skin:    General: Skin is warm and dry.     Findings: No bruising.  Neurological:     General: No focal deficit present.     Mental Status: He is alert and oriented to person, place, and time.     Sensory: No sensory deficit.     Motor: No weakness.  Psychiatric:        Mood and Affect: Mood normal.        Behavior: Behavior normal.     ED Results / Procedures / Treatments   Labs (all labs ordered are listed, but only abnormal results are displayed) Labs Reviewed - No data to display  EKG None  Radiology DG Hip Unilat  With Pelvis 2-3 Views Right Result Date: 12/03/2023 CLINICAL DATA:  83 year old male with 2-3 days of right hip pain, no known injury. EXAM: DG HIP (WITH OR WITHOUT PELVIS) 2-3V RIGHT COMPARISON:  CT Abdomen and Pelvis 01/28/2022. FINDINGS: Chronic prostate brachytherapy. Bone mineralization appears stable and within normal limits. Femoral heads remain normally located. Hip joint spaces appear stable and symmetric. No pelvis fracture identified. SI joints appear normal. Nonobstructed bowel-gas pattern. Grossly intact proximal left femur. On dedicated AP and frog leg views the proximal right femur appears intact and normal. IMPRESSION: 1. No acute osseous abnormality identified about the right hip or pelvis. 2. Chronic prostate brachytherapy. Electronically Signed   By: VEAR Hurst M.D.   On: 12/03/2023 10:08    Procedures Procedures    Medications Ordered in ED Medications  lidocaine  (LIDODERM ) 5 % 1-3 patch (1 patch Transdermal Patch Applied 12/03/23 1143)  ibuprofen  (ADVIL ) tablet 600 mg (600 mg Oral Given 12/03/23 1142)  predniSONE  (DELTASONE ) tablet 60 mg (60 mg Oral Given 12/03/23 1141)    ED Course/ Medical Decision Making/ A&P Clinical Course as of 12/03/23 1211  Thu Dec 03, 2023  1210 RN alerted me of HR in the 40's. EKG with  sinus brady. Not on any BB. Patient is stable for discharge home with outpatient follow up. [VK]    Clinical Course User Index [VK] Kingsley, Akoni Parton K, DO                                 Medical Decision Making This patient presents  to the ED with chief complaint(s) of right hip pain with pertinent past medical history of CAD, A-fib not on [facility], prior SDH which further complicates the presenting complaint. The complaint involves an extensive differential diagnosis and also carries with it a high risk of complications and morbidity.    The differential diagnosis includes fracture, dislocation, musculoskeletal pain, bursitis, no palpable hernia, no signs of neurovascular injury, negative straight leg raise making sciatica unlikely  Additional history obtained: Additional history obtained from N/A Records reviewed previous admission documents  ED Course and Reassessment: On patient's arrival he is hemodynamically stable in no acute distress.  Was initially evaluated in triage and had right hip x-ray performed.  X-ray showed no acute disease.  He is point tender over the greater trochanter concerning for a trochanteric bursitis.  Patient will be given pain control and will be given Medrol  Dosepak.  Recommended primary care follow-up in orthopedics as needed and was given strict return precautions.  Independent labs interpretation:  N/A  Independent visualization of imaging: - I independently visualized the following imaging with scope of interpretation limited to determining acute life threatening conditions related to emergency care: R hip XR, which revealed no acute disease  Consultation: - Consulted or discussed management/test interpretation w/ external professional: n/A  Consideration for admission or further workup: Patient has no emergent conditions requiring admission or further work-up at this time and is stable for discharge home with primary care follow-up  Social Determinants of  health: N/A    Amount and/or Complexity of Data Reviewed Radiology: ordered.  Risk Prescription drug management.          Final Clinical Impression(s) / ED Diagnoses Final diagnoses:  Right hip pain    Rx / DC Orders ED Discharge Orders          Ordered    methylPREDNISolone  (MEDROL  DOSEPAK) 4 MG TBPK tablet  Nightly        12/03/23 1143    naproxen  (NAPROSYN ) 500 MG tablet  2 times daily        12/03/23 1143              Kingsley, Talin Feister K, DO 12/03/23 1144

## 2023-12-03 NOTE — ED Notes (Signed)
 Pt ambulatory to bathroom with steady gait.

## 2023-12-03 NOTE — Discharge Instructions (Addendum)
 You were seen in the emergency department for your hip pain.  Your x-ray showed no broken bones.  You likely have bursitis which is inflammation of one of the sacs in your hip that helped questions the hip.  You can take naproxen  twice daily for the next few days.  This can cause stomach upset so do not take it on empty stomach and make sure you are taking your daily antacid.  I have also given you a steroid taper.  You can also take Tylenol  up to every 6 hours.  You can apply ice or heat.  You can follow-up with your primary doctor to have your symptoms rechecked and orthopedics as needed.  You should return to the emergency department if you have fevers, redness and swelling of your hip, you are not able to walk or if you have any other new or concerning symptoms.

## 2023-12-03 NOTE — ED Triage Notes (Signed)
 Pt c/o right hip pain for past 2-3 days with no known injury.  Pain worse in the morning and with movement and weight bearing.

## 2023-12-03 NOTE — ED Notes (Signed)
 Provider notified of HR in 40s. Pt reports normal HR for him is in 50s, provider advised EKG. Pt denies any additional symptoms, in NAD

## 2023-12-08 ENCOUNTER — Ambulatory Visit: Payer: Medicare Other | Admitting: Internal Medicine

## 2023-12-08 VITALS — BP 118/60 | HR 50 | Temp 97.7°F | Resp 18 | Ht 71.0 in | Wt 169.0 lb

## 2023-12-08 DIAGNOSIS — M25551 Pain in right hip: Secondary | ICD-10-CM | POA: Insufficient documentation

## 2023-12-08 MED ORDER — DICLOFENAC SODIUM 1 % EX GEL: 2.0000 g | Freq: Four times a day (QID) | CUTANEOUS | 0 refills | Status: DC

## 2023-12-08 NOTE — Progress Notes (Signed)
   Acute Office Visit  Subjective:     Patient ID: Star Cheese, male    DOB: Oct 03, 1941, 83 y.o.   MRN: [id number]  Chief Complaint  Patient presents with   office visit    Patient here for  right Hip pain since 4 days    HPI Patient is in today for right hip pain for 4 days. No fall and no injury. He went to ED on 1/9 for this problem and xray was negative for any fracture.   Review of Systems  Constitutional: Negative.   Musculoskeletal:  Positive for joint pain.        Objective:    BP 118/60 (BP Location: Left Arm, Patient Position: Standing;Sitting, Cuff Size: Normal)   Pulse (!) 50   Temp 97.7 F (36.5 C)   Resp 18   Ht 5' 11 (1.803 m)   Wt 169 lb (76.7 kg)   SpO2 97%   BMI 23.57 kg/m    Physical Exam Constitutional:      Appearance: Normal appearance.  Musculoskeletal:     Comments: Tenderness right iliac crest but no bruise.  Neurological:     Mental Status: He is alert.     No results found for any visits on 12/08/23.      Assessment & Plan:   Problem List Items Addressed This Visit       Other   Pain in right hip - Primary   He will apply volatren gel there four times a day and if not better then he will call.       No orders of the defined types were placed in this encounter.   No follow-ups on file.  Roetta Dare, MD

## 2023-12-08 NOTE — Assessment & Plan Note (Signed)
 He will apply volatren gel there four times a day and if not better then he will call.

## 2023-12-09 ENCOUNTER — Other Ambulatory Visit: Payer: Self-pay

## 2024-01-18 ENCOUNTER — Other Ambulatory Visit: Payer: Self-pay | Admitting: Neurology

## 2024-01-21 ENCOUNTER — Encounter: Payer: Self-pay | Admitting: Cardiology

## 2024-01-21 ENCOUNTER — Ambulatory Visit: Payer: Medicare Other | Attending: Cardiology | Admitting: Cardiology

## 2024-01-21 VITALS — BP 138/68 | HR 80 | Resp 16 | Ht 71.0 in | Wt 168.0 lb

## 2024-01-21 DIAGNOSIS — I340 Nonrheumatic mitral (valve) insufficiency: Secondary | ICD-10-CM

## 2024-01-21 DIAGNOSIS — I10 Essential (primary) hypertension: Secondary | ICD-10-CM | POA: Diagnosis present

## 2024-01-21 DIAGNOSIS — I2510 Atherosclerotic heart disease of native coronary artery without angina pectoris: Secondary | ICD-10-CM | POA: Diagnosis present

## 2024-01-21 MED ORDER — ROSUVASTATIN CALCIUM 40 MG PO TABS: 40.0000 mg | ORAL_TABLET | Freq: Every day | ORAL | 3 refills | Status: AC

## 2024-01-21 MED ORDER — ASPIRIN 81 MG PO TBEC: 81.0000 mg | DELAYED_RELEASE_TABLET | Freq: Every day | ORAL | 3 refills | Status: AC

## 2024-01-21 NOTE — Patient Instructions (Signed)
 Medication Instructions:  Start aspirin 81 mg a day  *If you need a refill on your cardiac medications before your next appointment, please call your pharmacy*   Lab Work:  If you have labs (blood work) drawn today and your tests are completely normal, you will receive your results only by: MyChart Message (if you have MyChart) OR A paper copy in the mail If you have any lab test that is abnormal or we need to change your treatment, we will call you to review the results.   Testing/Procedures:    Follow-Up: At [facility], you and your health needs are our priority.  As part of our continuing mission to provide you with exceptional heart care, we have created designated Provider Care Teams.  These Care Teams include your primary Cardiologist (physician) and Advanced Practice Providers (APPs -  Physician Assistants and Nurse Practitioners) who all work together to provide you with the care you need, when you need it.  We recommend signing up for the patient portal called "MyChart".  Sign up information is provided on this After Visit Summary.  MyChart is used to connect with patients for Virtual Visits (Telemedicine).  Patients are able to view lab/test results, encounter notes, upcoming appointments, etc.  Non-urgent messages can be sent to your provider as well.   To learn more about what you can do with MyChart, go to [email or web address].    Your next appointment:   Dr Rosemary Holms    Other Instructions

## 2024-01-21 NOTE — Progress Notes (Signed)
 Cardiology Office Note:  .   Date:  01/21/2024  ID:  Robert Horne, DOB 01/25/1941, MRN [id number] PCP: Robert Northern, MD  [facility] Providers Cardiologist:  Robert Mainland, MD PCP: Robert Northern, MD  Chief Complaint  Patient presents with   Coronary Artery Disease   Follow-up    6 month      History of Present Illness: .    Robert Horne is a 83 y.o. male with hypertension, CAD, remote h/o PCI, hypothyroidism, prostate cancer, Parkinson's disease, brief A. fib episode during COVID hospitalization in 10/2021  Patient was hospitalized in 08/2023 with falls concerning for syncope and bradycardia.  He was seen by Dr. Lennie Horne.  Falls were thought more likely to be due to Parkinson's disease rather than true syncope.  He was placed on 2-week monitor.  Patient has not had any recent falls or syncope episodes.  He denies any cardiac complaints.  Previously, he was on Plavix monotherapy given his history of CAD and remote stroke.  It appears that this was discontinued at some point between his last visit with me and now.  Vitals:   01/21/24 1325  BP: 138/68  Pulse: 80  Resp: 16  SpO2: 98%     ROS:  Review of Systems  Cardiovascular:  Negative for chest pain, dyspnea on exertion, leg swelling, palpitations and syncope.     Studies Reviewed: Marland Kitchen        EKG 01/21/2024: Normal sinus rhythm Left anterior fascicular block When compared with ECG of 03-Dec-2023 12:05, No significant change was found    Independently interpreted 09/2023: Hb 14.9 Cr 1.0  06/2023: Chol 130, TG 77, HDL 58, LDL 57  Zio patch monitor 13 days 09/25/2023 - 10/09/2023: Dominant rhythm: Sinus w/first degree AV block HR 38-119 bpm. Avg HR 69 bpm. 2 episodes of atrial tachycardia, fastest and longest at 119 bpm for 6 beats. <1% isolated SVE, couplet/triplets. 0 episodes of VT. <1% isolated VE, couplet/triplets. No atrial fibrillation/atrial flutter/VT/high grade AV block, sinus pause >3sec  noted. 2 patient triggered events, correlated with sinus rhythm.      Physical Exam:   Physical Exam Vitals and nursing note reviewed.  Constitutional:      General: He is not in acute distress. Neck:     Vascular: No JVD.  Cardiovascular:     Rate and Rhythm: Normal rate and regular rhythm.     Heart sounds: Normal heart sounds. No murmur heard. Pulmonary:     Effort: Pulmonary effort is normal.     Breath sounds: Normal breath sounds. No wheezing or rales.  Musculoskeletal:     Right lower leg: No edema.     Left lower leg: No edema.      VISIT DIAGNOSES:   ICD-10-CM   1. Coronary artery disease of native artery of native heart with stable angina pectoris (HCC)  I25.118 EKG 12-Lead    CANCELED: EKG 12-Lead       ASSESSMENT AND PLAN: .    Robert Horne is a 83 y.o. male with hypertension, CAD, remote h/o PCI, hypothyroidism, prostate cancer, Parkinson's disease, brief A. fib episode during COVID hospitalization in 10/2021   CAD: Remote history. One angina episode. Exercise nuclear stress test with 3.7 METS, no ischemia (08/2022). Chol 187, TG 109, HDL 58, LDL 110 (01/2023). Chol 130, TG 77, HDL 58, LDL 57 (05/2023). Continue Crestor 20 mg daily. Currently, he is not on any antiplatelet therapy, had been on Plavix in the past. I am unaware  of any specific reason why he should not be on 1. I will start him on aspirin 81 mg daily.  Mitral regurgitation: Mild mitral regurgitation with late systolic mitral prolapse. Clinically stable. Will check echocardiogram in 02/2025, will order at next visit.   Hypertension: Well controlled.   Afib: While CHA2DS2VSc score is 5, he has not had any other known A-fib episodes, other than brief episode <24 hrs during COVID hospitalization in 10/2021. No A-fib seen on to be cardiac telemetry.  I have encouraged him to consider wearing a smart watch or home monitoring.   Okay to hold off anticoagulation, unless recurrent  Afib. Recent Zio monitor again shows no A-fib episodes (09/2023).   H/o TIA: Remote history. MRI brain 07/2021 showed no stroke, shows chronic microvascular changes. Recommend Aspirin 81 mg daily.     Meds ordered this encounter  Medications   rosuvastatin (CRESTOR) 40 MG tablet    Sig: Take 1 tablet (40 mg total) by mouth daily.    Dispense:  90 tablet    Refill:  3   aspirin EC 81 MG tablet    Sig: Take 1 tablet (81 mg total) by mouth daily. Swallow whole.    Dispense:  100 tablet    Refill:  3     F/u in 6 months  Signed, Robert Negus, MD

## 2024-02-09 ENCOUNTER — Ambulatory Visit: Payer: Medicare Other | Admitting: Internal Medicine

## 2024-03-10 ENCOUNTER — Encounter: Payer: Self-pay | Admitting: Adult Health

## 2024-03-10 ENCOUNTER — Ambulatory Visit (INDEPENDENT_AMBULATORY_CARE_PROVIDER_SITE_OTHER): Payer: Medicare Other | Admitting: Adult Health

## 2024-03-10 VITALS — BP 129/69 | HR 66 | Ht 71.0 in | Wt 173.0 lb

## 2024-03-10 DIAGNOSIS — R413 Other amnesia: Secondary | ICD-10-CM | POA: Diagnosis not present

## 2024-03-10 DIAGNOSIS — G20A1 Parkinson's disease without dyskinesia, without mention of fluctuations: Secondary | ICD-10-CM

## 2024-03-10 NOTE — Patient Instructions (Addendum)
 Your Plan:  Continue Sinemet 1 tab three times daily  Please continue to be cautious with driving. Would recommend pursing driving evaluation to ensure safety with driving   Continue to stay active and ensure adequate hydration   Continue to monitor mood - if this should worsen, please let us  know  Ensure you establish care with a PCP for routine follow up     Follow up in 6 months or call earlier if needed      Thank you for coming to see us  at [facility]. I hope we have been able to provide you high quality care today.  You may receive a patient satisfaction survey over the next few weeks. We would appreciate your feedback and comments so that we may continue to improve ourselves and the health of our patients.

## 2024-03-10 NOTE — Progress Notes (Signed)
 [facility] [address] [place]. [place] [address] [phone]       OFFICE FOLLOW UP NOTE  [name] Date of Birth:  June 07, 1941 Medical Record Number:  [id number]    Primary neurologist: Dr. Frances Furbish Reason for visit: Parkinson's disease     SUBJECTIVE:  CHIEF COMPLAINT:  Chief Complaint  Patient presents with   Tremors    Rm 3 alone Pt is well, repots his tremors gets worse at times especially when he is cold. No other concerns.     Follow-up visit:  Prior visit: 08/26/2023 with Dr. Frances Furbish  Brief HPI:   Robert Horne is a 83 y.o. male with an underlying medical history of prostate cancer, coronary artery disease with his status post stent placement, hypothyroidism, A-fib, hypertension, hyperlipidemia, anxiety, and depression, who is followed for left-sided parkinsonism.  He has previously underwent neuropsychological testing with Dr. 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  He was switched from Sinemet IR to CR in 02/2023 due to difficulty tolerating IR.  At prior visit Dr. Frances Furbish, reported overall doing well on Sinemet CR 1 tab 3 times daily.     Interval history:  Patient returns for follow-up visit unaccompanied although his daughter does accompany him via cell phone.  Reports overall stable since prior visit.  Reports tremors can worsen when he is cold.  Tremors do not interfere with daily activity or functioning.  Gait overall stable, has not had a fall over the past several months.  Did have a fall back in October resulting in SDH and episode of lightheadedness in November suspected due to autonomic dysfunction.  Continued on Sinemet CR 1 tab 3 times daily, typically around 7 AM, 12pm (although can " widely vary") and around bedtime 8:30-9pm.  Reports tolerating well.  Denies any wearing off symptoms even if he is late for a dose.  Has been struggling with urinary symptoms including urgency and incontinence, currently being  followed by [facility] on vibegron, tamsulosin and Myrbetriq with improvement of symptoms.  He continues to reside at [facility], maintains ADLs independently, manages own medications and prepares breakfast and lunch, dinner provided.  He does continue to drive but typically short distance and has not had any issues with this.  Daughter questions and continuing to drive.  Believes cognition is overall stable without progression, more issues with short-term memory and learning new tasks.  He has been struggling some with depression, gets tearful quickly.  He is currently on bupropion 300mg  daily and mirtazapine per PCP.  Denies any current issues with constipation.  Denies any swallowing difficulties.  He has been experiencing right hip pain which improved after injection but plans on proceeding with MRI L-spine for persistent buttock pain with concern of L3-L4 nerve impingement.      ROS:   14 system review of systems performed and negative with exception of those listed in HPI  PMH:  Past Medical History:  Diagnosis Date   Cancer (HCC)    prostate, 2002   H/O heart artery stent    Hypothyroidism     PSH:  Past Surgical History:  Procedure Laterality Date   BACK SURGERY     CARDIAC CATHETERIZATION     CATARACT EXTRACTION Bilateral    PROSTATE SURGERY     REVERSE SHOULDER ARTHROPLASTY Right 11/06/2021   Procedure: REVERSE SHOULDER ARTHROPLASTY;  Surgeon: Robert Pippin, MD;  Location: [facility];  Service: Orthopedics;  Laterality: Right;   TONSILLECTOMY  Social History:  Social History   Socioeconomic History   Marital status: Widowed    Spouse name: Not on file   Number of children: 3   Years of education: Not on file   Highest education level: Not on file  Occupational History   Not on file  Tobacco Use   Smoking status: Former    Current packs/day: 0.00    Average packs/day: 1 pack/day for 8.0 years (8.0 ttl pk-yrs)    Types: Cigarettes    Start date:  10/24/1962    Quit date: 10/24/1970    Years since quitting: 53.4   Smokeless tobacco: Never  Vaping Use   Vaping status: Never Used  Substance and Sexual Activity   Alcohol use: Yes    Alcohol/week: 2.0 standard drinks of alcohol    Types: 2 Glasses of wine per week    Comment: weekly   Drug use: Never   Sexual activity: Not on file  Other Topics Concern   Not on file  Social History Narrative   Caffiene 1-2 cup coffee daily.    Retired.   Salesman.   Social Drivers of Corporate investment banker Strain: Not on file  Food Insecurity: No Food Insecurity (09/19/2023)   Hunger Vital Sign    Worried About Running Out of Food in the Last Year: Never true    Ran Out of Food in the Last Year: Never true  Transportation Needs: No Transportation Needs (09/19/2023)   PRAPARE - Administrator, Civil Service (Medical): No    Lack of Transportation (Non-Medical): No  Physical Activity: Not on file  Stress: Not on file  Social Connections: Not on file  Intimate Partner Violence: Not At Risk (09/19/2023)   Humiliation, Afraid, Rape, and Kick questionnaire    Fear of Current or Ex-Partner: No    Emotionally Abused: No    Physically Abused: No    Sexually Abused: No    Family History:  Family History  Problem Relation Age of Onset   Stroke Sister 10   Lung cancer Sister    Colon cancer Neg Hx    Stomach cancer Neg Hx    Esophageal cancer Neg Hx    Parkinson's disease Neg Hx     Medications:   Current Outpatient Medications on File Prior to Visit  Medication Sig Dispense Refill   ALPRAZolam (XANAX) 1 MG tablet Take 1 tablet (1 mg total) by mouth daily as needed for anxiety. 15 tablet 0   aspirin EC 81 MG tablet Take 1 tablet (81 mg total) by mouth daily. Swallow whole. 100 tablet 3   baclofen (LIORESAL) 10 MG tablet Take 10 mg by mouth 3 (three) times daily.     buPROPion (WELLBUTRIN XL) 300 MG 24 hr tablet Take 1 tablet (300 mg total) by mouth daily. 30 tablet 0    Carbidopa-Levodopa ER (SINEMET CR) 25-100 MG tablet controlled release TAKE 1 TABLET BY MOUTH AT 7 IN THE MORNING THEN TAKE 1 TABLET BY MOUTH AT NOON, THEN TAKE 1 TABLET BY MOUTH AT 5 IN THE EVENING 90 tablet 3   Cholecalciferol (DIALYVITE VITAMIN D 5000) 125 MCG (5000 UT) capsule Take 1 capsule (5,000 Units total) by mouth daily.     esomeprazole (NEXIUM) 20 MG capsule Take 20 mg by mouth daily at 12 noon.     levothyroxine (SYNTHROID) 75 MCG tablet TAKE 1 TABLET BY MOUTH DAILY BEFORE BREAKFAST 30 tablet 6   mirtazapine (REMERON) 45 MG tablet Take  1 tablet (45 mg total) by mouth at bedtime. 20 tablet 0   MYRBETRIQ 50 MG TB24 tablet Take 50 mg by mouth daily.     rosuvastatin (CRESTOR) 40 MG tablet Take 1 tablet (40 mg total) by mouth daily. 90 tablet 3   tamsulosin (FLOMAX) 0.4 MG CAPS capsule Take 0.4 mg by mouth daily after supper.     thyroid (NP THYROID) 60 MG tablet Take 60 mg by mouth daily before breakfast.     Vibegron (GEMTESA) 75 MG TABS Take 1 tablet (75 mg total) by mouth daily. 30 tablet 3   No current facility-administered medications on file prior to visit.    Allergies:  No Known Allergies    OBJECTIVE:  Physical Exam  Vitals:   03/10/24 1338  BP: 129/69  Pulse: 66  Weight: 173 lb (78.5 kg)  Height: 5\' 11"  (1.803 m)   Body mass index is 24.13 kg/m. No results found.  General: well developed, well nourished, very pleasant elderly Caucasian male, seated, in no evident distress  Neurologic Exam Mental Status: Awake and fully alert.  No evidence of aphasia or dysarthria.  Mild hypophonia.  Oriented to place and time. Recent memory impaired and remote memory intact. Attention span, concentration and fund of knowledge appropriate. Mood and affect appropriate.  Recall 1/3.  4 legged animal naming 9 in 60 seconds.  Adequate serial addition.  Mild to moderate facial masking.  Cranial Nerves: Pupils equal, briskly reactive to light. Extraocular movements full without  nystagmus. Visual fields full to confrontation. Hearing intact. Facial sensation intact. Face, tongue, palate moves normally and symmetrically.  Motor: Normal strength in all tested extremity muscles.  Slight increased tone in upper extremities.  Mild intermittent resting tremor of bilateral upper extremities.  No lower extremity tremor noted.  No significant postural or action tremor.  Mild impairment of finger taps L>R.  Gait and Station: Arises from chair without difficulty. Stance is slightly hunched. Gait demonstrates decreased stride length bilaterally with decreased arm swing L>R. No use of AD Reflexes: 1+ and symmetric. Toes downgoing.        ASSESSMENT/PLAN: Robert Horne is a 83 y.o. year old male who returns for follow-up for left-sided parkinsonism associated with memory loss.    Parkinson's disease:  Continue Sinemet CR 25-100 1 tab 3 times daily, discussed importance of taking routinely and would recommend taking about every 6 hours starting at 7 AM Prior intolerance to Sinemet IR Discussed importance of routine physical activity and exercise Discussed importance of adequate water intake with at least 6 to 8 glasses of water per day Reports cognition stable, continue to monitor.   Discussed cautious driving with limited driving to short distance, avoiding main road or highways and avoidance of nighttime driving.  Consider driving evaluation if needed especially if symptoms progress.     Follow up in 6 months or call earlier if needed   CC:  PCP: Tita Form, MD    I spent 30 minutes of face-to-face and non-face-to-face time with patient and daughter (via telephone).  This included previsit chart review, lab review, study review, order entry, electronic health record documentation, patient education and discussion regarding above diagnoses and treatment plan and answered all other questions to patient and daughters satisfaction  Johny Nap, [facility]  [facility] [address] [place], [place] [address]  Phone [phone] Fax [phone] Note: This document was prepared with digital dictation and possible smart phrase technology. Any transcriptional errors that result from this  process are unintentional.

## 2024-03-14 ENCOUNTER — Telehealth: Payer: Self-pay | Admitting: Adult Health

## 2024-03-14 NOTE — Telephone Encounter (Signed)
 Pt calling to ask for list of places recommended to get driving evaluation that was discussed at office visit. Would like a call back.

## 2024-04-07 ENCOUNTER — Encounter: Payer: Self-pay | Admitting: Internal Medicine

## 2024-04-07 ENCOUNTER — Ambulatory Visit: Admitting: Internal Medicine

## 2024-04-07 ENCOUNTER — Other Ambulatory Visit: Payer: Self-pay | Admitting: Internal Medicine

## 2024-04-07 VITALS — BP 116/60 | HR 80 | Temp 97.1°F | Resp 18 | Ht 71.0 in | Wt 178.1 lb

## 2024-04-07 DIAGNOSIS — E039 Hypothyroidism, unspecified: Secondary | ICD-10-CM | POA: Diagnosis not present

## 2024-04-07 DIAGNOSIS — R252 Cramp and spasm: Secondary | ICD-10-CM | POA: Diagnosis not present

## 2024-04-07 DIAGNOSIS — E782 Mixed hyperlipidemia: Secondary | ICD-10-CM

## 2024-04-07 NOTE — Assessment & Plan Note (Signed)
 Plan as below.  This is not restless leg syndrome.

## 2024-04-07 NOTE — Assessment & Plan Note (Signed)
 I am going to check a number of labs on him including a CBC, CMP, magnesium  level and thyroid  studies.  Further care will depend on his workup.

## 2024-04-07 NOTE — Progress Notes (Signed)
 Office Visit  Subjective   Patient ID: Robert Horne   DOB: Oct 19, 1941   Age: 83 y.o.   MRN: [id number]   Chief Complaint Chief Complaint  Patient presents with   office visit    Patient here fatigue , discuss medication changes     History of Present Illness Mr. Dils is a 83 yo male who comes in today for multiple reasons including leg/hand cramping and need for labs.  This patient has a PMHx of left-sided parkinsonism.  He is followed by Dr. Omar Bibber in neurology with last visit with that group in 03/10/2024.  The patient had previously reported to them a several month history of tremors and fine motor dyscontrol, also forgetfulness and word finding difficulty.  There was no family history of Parkinson's disease.  He has had improvement in his tremor.  He noticed this improvement almost immediately after starting the levodopa .  Again, he had benefit from levodopa  therapy but had trouble tolerating it, becoming quite queasy after taking it.  He was switched from Sinemet  IR to CR in 02/2023 due to difficulty tolerating IR.  He has previously underwent neuropsychological testing with Dr. 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  The patient comes in today with problems not with tremor or restless legs but states he has been having muscle cramps in his hands and feet over the last 6 months which has increasingly worsened.  He did not talk to neurology about this when they saw him last month.  He states his tremor in his left hand is completely different than the cramping he is getting in both hands and feet.  This can occur at any time but he notices his feet/ankles have worsening camping when he lies down to sleep at night.  His legs/calves have no cramping.  He has not started any new medications.  He states he is drinking plenty of water .    The patient also tells me that he has a history of hypothyroidism.  He states he just developed hypothyroidism years ago and he is on NP  thyroid  where he tells me that levothyroxine  was not bringing up his Free T3 levels in the past.  I have reviewed his records and I do not see any recent TFT's in his chart.  He is currently on levothyroxine  75mcg po daily and NP thyroid  60mg  daily.  Today, he states he has had fatigue for a while.  There is no unexplained weight loss/gain, heat/cold intolerance, worsening tremors, anxiety, irritabiity, dry skin, constipation or other problems.  The patient has a history of hyperlipidemia.  He also has a history of CAD and TIA in 2022.  He has not had any cholesterol labs drawn in a while as well .  He is currently followed by cardiology who last saw him in 01/21/2024 and they restarted him on an ASA 81mg  daily.  He has a remote history of TIA where a MRI brain done on 07/2021 showed no stroke but showed chronic microvascular changes.  He denies any side effects with his statin today.  He denies any abdominal pain, nausea, vomiting, myalgias but he does have fatigue but does not attribute it to his statin.  He is currently on crestor  40mg  po at bedtime.           Past Medical History Past Medical History:  Diagnosis Date   Cancer [facility])    prostate, 2002   H/O heart artery stent    Hypothyroidism  Allergies No Known Allergies   Medications  Current Outpatient Medications:    ALPRAZolam  (XANAX ) 1 MG tablet, Take 1 tablet (1 mg total) by mouth daily as needed for anxiety., Disp: 15 tablet, Rfl: 0   aspirin  EC 81 MG tablet, Take 1 tablet (81 mg total) by mouth daily. Swallow whole., Disp: 100 tablet, Rfl: 3   baclofen (LIORESAL) 10 MG tablet, Take 10 mg by mouth 3 (three) times daily., Disp: , Rfl:    buPROPion  (WELLBUTRIN  XL) 300 MG 24 hr tablet, Take 1 tablet (300 mg total) by mouth daily., Disp: 30 tablet, Rfl: 0   Carbidopa -Levodopa  ER (SINEMET  CR) 25-100 MG tablet controlled release, TAKE 1 TABLET BY MOUTH AT 7 IN THE MORNING THEN TAKE 1 TABLET BY MOUTH AT NOON, THEN TAKE 1 TABLET BY  MOUTH AT 5 IN THE EVENING, Disp: 90 tablet, Rfl: 3   Cholecalciferol  (DIALYVITE  VITAMIN D  5000) 125 MCG (5000 UT) capsule, Take 1 capsule (5,000 Units total) by mouth daily., Disp: , Rfl:    esomeprazole  (NEXIUM ) 20 MG capsule, Take 20 mg by mouth daily at 12 noon., Disp: , Rfl:    levothyroxine  (SYNTHROID ) 75 MCG tablet, TAKE 1 TABLET BY MOUTH DAILY BEFORE BREAKFAST, Disp: 30 tablet, Rfl: 6   mirtazapine  (REMERON ) 45 MG tablet, Take 1 tablet (45 mg total) by mouth at bedtime., Disp: 20 tablet, Rfl: 0   MYRBETRIQ 50 MG TB24 tablet, Take 50 mg by mouth daily., Disp: , Rfl:    rosuvastatin  (CRESTOR ) 40 MG tablet, Take 1 tablet (40 mg total) by mouth daily., Disp: 90 tablet, Rfl: 3   tamsulosin (FLOMAX) 0.4 MG CAPS capsule, Take 0.4 mg by mouth daily after supper., Disp: , Rfl:    thyroid  (NP THYROID ) 60 MG tablet, Take 60 mg by mouth daily before breakfast., Disp: , Rfl:    Vibegron  (GEMTESA ) 75 MG TABS, Take 1 tablet (75 mg total) by mouth daily., Disp: 30 tablet, Rfl: 3   Review of Systems Review of Systems  Constitutional:  Positive for malaise/fatigue. Negative for chills, fever and weight loss.  Eyes:  Negative for blurred vision and double vision.  Respiratory:  Negative for cough and shortness of breath.   Cardiovascular:  Negative for chest pain, palpitations and leg swelling.  Gastrointestinal:  Negative for abdominal pain, constipation, diarrhea, nausea and vomiting.  Genitourinary:  Negative for frequency.  Musculoskeletal:  Negative for myalgias.  Skin:  Negative for itching and rash.  Neurological:  Negative for dizziness, weakness and headaches.  Endo/Heme/Allergies:  Negative for polydipsia.       Objective:    Vitals BP 116/60   Pulse 80   Temp (!) 97.1 F (36.2 C)   Resp 18   Ht 5\' 11"  (1.803 m)   Wt 178 lb 2 oz (80.8 kg)   SpO2 98%   BMI 24.84 kg/m    Physical Examination Physical Exam Constitutional:      Appearance: Normal appearance. He is not  ill-appearing.  Neck:     Vascular: No carotid bruit.  Cardiovascular:     Rate and Rhythm: Normal rate and regular rhythm.     Pulses: Normal pulses.     Heart sounds: No murmur heard.    No friction rub. No gallop.  Pulmonary:     Effort: Pulmonary effort is normal. No respiratory distress.     Breath sounds: No wheezing, rhonchi or rales.  Abdominal:     General: Abdomen is flat. Bowel sounds are normal. There is  no distension.     Palpations: Abdomen is soft.     Tenderness: There is no abdominal tenderness.  Musculoskeletal:     Right lower leg: No edema.     Left lower leg: No edema.  Skin:    General: Skin is warm and dry.     Findings: No rash.  Neurological:     General: No focal deficit present.     Mental Status: He is alert and oriented to person, place, and time.  Psychiatric:        Mood and Affect: Mood normal.        Behavior: Behavior normal.        Assessment & Plan:   Hypothyroidism We will check his TFT's since he is having cramping.  He is on levothyroxine  and NP thyroid .  We will check his TSH, Free T4 and Free T3.  Cramping of hands I am going to check a number of labs on him including a CBC, CMP, magnesium  level and thyroid  studies.  Further care will depend on his workup.  Cramping of feet Plan as below.  This is not restless leg syndrome.  Hyperlipidemia He has a history of CAD and TIA in the past.  His goal LDL <70.  We will check his FLP today.    Return in about 3 months (around 07/08/2024).   Wayne Haines, MD

## 2024-04-07 NOTE — Assessment & Plan Note (Signed)
 We will check his TFT's since he is having cramping.  He is on levothyroxine  and NP thyroid .  We will check his TSH, Free T4 and Free T3.

## 2024-04-07 NOTE — Assessment & Plan Note (Signed)
 He has a history of CAD and TIA in the past.  His goal LDL <70.  We will check his FLP today.

## 2024-04-08 LAB — CBC WITH DIFFERENTIAL/PLATELET
Basophils Absolute: 0.1 10*3/uL (ref 0.0–0.2)
Basos: 1 %
EOS (ABSOLUTE): 0.1 10*3/uL (ref 0.0–0.4)
Eos: 2 %
Hematocrit: 45 % (ref 37.5–51.0)
Hemoglobin: 14.8 g/dL (ref 13.0–17.7)
Immature Grans (Abs): 0 10*3/uL (ref 0.0–0.1)
Immature Granulocytes: 0 %
Lymphocytes Absolute: 1.9 10*3/uL (ref 0.7–3.1)
Lymphs: 26 %
MCH: 31.4 pg (ref 26.6–33.0)
MCHC: 32.9 g/dL (ref 31.5–35.7)
MCV: 96 fL (ref 79–97)
Monocytes Absolute: 0.7 10*3/uL (ref 0.1–0.9)
Monocytes: 9 %
Neutrophils Absolute: 4.3 10*3/uL (ref 1.4–7.0)
Neutrophils: 62 %
Platelets: 251 10*3/uL (ref 150–450)
RBC: 4.71 x10E6/uL (ref 4.14–5.80)
RDW: 12.5 % (ref 11.6–15.4)
WBC: 7 10*3/uL (ref 3.4–10.8)

## 2024-04-08 LAB — LIPID PANEL W/O CHOL/HDL RATIO
Cholesterol, Total: 218 mg/dL — ABNORMAL HIGH (ref 100–199)
HDL: 63 mg/dL (ref >39)
LDL Chol Calc (NIH): 127 mg/dL — ABNORMAL HIGH (ref 0–99)
Triglycerides: 157 mg/dL — ABNORMAL HIGH (ref 0–149)
VLDL Cholesterol Cal: 28 mg/dL (ref 5–40)

## 2024-04-08 LAB — COMPREHENSIVE METABOLIC PANEL WITH GFR
ALT: 19 IU/L (ref 0–44)
AST: 20 IU/L (ref 0–40)
Albumin: 4.3 g/dL (ref 3.7–4.7)
Alkaline Phosphatase: 131 IU/L — ABNORMAL HIGH (ref 44–121)
BUN/Creatinine Ratio: 14 (ref 10–24)
BUN: 12 mg/dL (ref 8–27)
Bilirubin Total: 0.4 mg/dL (ref 0.0–1.2)
CO2: 25 mmol/L (ref 20–29)
Calcium: 9.1 mg/dL (ref 8.6–10.2)
Chloride: 101 mmol/L (ref 96–106)
Creatinine, Ser: 0.87 mg/dL (ref 0.76–1.27)
Globulin, Total: 2.3 g/dL (ref 1.5–4.5)
Glucose: 134 mg/dL — ABNORMAL HIGH (ref 70–99)
Potassium: 4.5 mmol/L (ref 3.5–5.2)
Sodium: 138 mmol/L (ref 134–144)
Total Protein: 6.6 g/dL (ref 6.0–8.5)
eGFR: 85 mL/min/{1.73_m2} (ref >59)

## 2024-04-08 LAB — T4, FREE: Free T4: 0.53 ng/dL — ABNORMAL LOW (ref 0.82–1.77)

## 2024-04-08 LAB — T3 UPTAKE: T3 Uptake Ratio: 25 % (ref 24–39)

## 2024-04-08 LAB — MAGNESIUM: Magnesium: 2.2 mg/dL (ref 1.6–2.3)

## 2024-04-08 LAB — TSH: TSH: 36.5 u[IU]/mL — ABNORMAL HIGH (ref 0.450–4.500)

## 2024-04-12 ENCOUNTER — Ambulatory Visit: Payer: Self-pay

## 2024-04-12 NOTE — Progress Notes (Signed)
 Patient called.  Left message for patient to call back.  I have called and left the patient a voicemail to return our phone call. The patient needs to be informed "His cholesterol is very elevated compared to last year. Is he really taking his crestor ? Get back to me on this. His hand and leg cramping may be due to his thyroid  levels. Continue NP thyroid  and increase his levothyroxine  to 100mcg daily. Please call this in and tell him to take his crestor .".

## 2024-04-14 ENCOUNTER — Other Ambulatory Visit: Payer: Self-pay

## 2024-05-20 ENCOUNTER — Other Ambulatory Visit: Payer: Self-pay | Admitting: Neurology

## 2024-05-20 NOTE — Telephone Encounter (Signed)
 Last seen on 03/10/24 Follow up scheduled on 10/17/24

## 2024-07-14 ENCOUNTER — Other Ambulatory Visit: Payer: Self-pay | Admitting: Internal Medicine

## 2024-07-14 ENCOUNTER — Ambulatory Visit: Admitting: Internal Medicine

## 2024-07-14 ENCOUNTER — Encounter: Payer: Self-pay | Admitting: Internal Medicine

## 2024-07-14 VITALS — BP 122/70 | HR 74 | Temp 97.6°F | Resp 18 | Ht 71.0 in | Wt 171.0 lb

## 2024-07-14 DIAGNOSIS — R531 Weakness: Secondary | ICD-10-CM | POA: Insufficient documentation

## 2024-07-14 DIAGNOSIS — R2689 Other abnormalities of gait and mobility: Secondary | ICD-10-CM | POA: Insufficient documentation

## 2024-07-14 DIAGNOSIS — E039 Hypothyroidism, unspecified: Secondary | ICD-10-CM

## 2024-07-14 NOTE — Assessment & Plan Note (Signed)
 I wonder if his parkinson's is worsening.  We will refer him to PT at this time

## 2024-07-14 NOTE — Assessment & Plan Note (Signed)
 He states he stopped taking his NP thyroid  a few months ago but I think he stopped it before then and that's why his TSH was elevated 3 months ago.  We will recheck his thyroid  function tests today.

## 2024-07-14 NOTE — Assessment & Plan Note (Signed)
 Plan as below.

## 2024-07-14 NOTE — Progress Notes (Signed)
 Office Visit  Subjective   Patient ID: Robert Horne   DOB: [birth year]   Age: 83 y.o.   MRN: [id number]   Chief Complaint Chief Complaint  Patient presents with   office visit    Need PT order for strengthening and balance      History of Present Illness Robert Horne is a 83 yo male who returns today for problems with generalized weakness and imbalance.  He states over the last 6 months, he has had generalized weakness all over.  He states he has stumbled and cross over on his feet in the past with he has had imbalance and has had a few times where he has almost fallen.  This patient has a PMHx of left-sided parkinsonism.  He is followed by Robert Horne in neurology with last visit with that group in 03/10/2024.  The patient had previously reported to them a several month history of tremors and fine motor dyscontrol, also forgetfulness and word finding difficulty.  There was no family history of Parkinson's disease.  He has had improvement in his tremor.  He noticed this improvement almost immediately after starting the levodopa .  Again, he had benefit from levodopa  therapy but had trouble tolerating it, becoming quite queasy after taking it.  He was switched from Sinemet  IR to CR in 02/2023 due to difficulty tolerating IR.  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  The cramping he previously had has greatly improved.  He states he has pain in his knees where if he stoops down, he has pain and weakness and will not be able to get up.  The patient also tells me that he has a history of hypothyroidism.  He states he just developed hypothyroidism years ago and he is on NP thyroid  where he tells me that levothyroxine  was not bringing up his Free T3 levels in the past.  I have reviewed his records and I do not see any recent TFT's in his chart.  He is currently on levothyroxine  75mcg po daily and NP thyroid  60mg  daily.  He  states that he quit his NP thyroid  a few months ago.  We checked his thyroid  functions 3 months ago and his TSH was elevated.  I increased his levothyroxine  to 100mcg daily at that time.   Today, he states he has had fatigue for a while.  There is no unexplained weight loss/gain, heat/cold intolerance, worsening tremors, anxiety, irritabiity, dry skin, constipation or other problems.      Past Medical History Past Medical History:  Diagnosis Date   Cancer [facility])    prostate, 2002   H/O heart artery stent    Hypothyroidism      Allergies No Known Allergies   Medications  Current Outpatient Medications:    ALPRAZolam  (XANAX ) 1 MG tablet, Take 1 tablet (1 mg total) by mouth daily as needed for anxiety., Disp: 15 tablet, Rfl: 0   aspirin  EC 81 MG tablet, Take 1 tablet (81 mg total) by mouth daily. Swallow whole., Disp: 100 tablet, Rfl: 3   baclofen (LIORESAL) 10 MG tablet, Take 10 mg by mouth 3 (three) times daily., Disp: , Rfl:    buPROPion  (WELLBUTRIN  XL) 300 MG 24 hr tablet, Take 1 tablet (300 mg total) by mouth daily., Disp: 30 tablet, Rfl: 0   Carbidopa -Levodopa  ER (SINEMET  CR) 25-100 MG tablet controlled release, TAKE 1 TABLET BY MOUTH AT 7 IN THE MORNING THEN TAKE 1 TABLET BY  MOUTH AT NOON, THEN TAKE 1 TABLET BY MOUTH AT 5 IN THE EVENING, Disp: 90 tablet, Rfl: 3   Cholecalciferol  (DIALYVITE  VITAMIN D  5000) 125 MCG (5000 UT) capsule, Take 1 capsule (5,000 Units total) by mouth daily., Disp: , Rfl:    esomeprazole  (NEXIUM ) 20 MG capsule, Take 20 mg by mouth daily at 12 noon., Disp: , Rfl:    levothyroxine  (SYNTHROID ) 100 MCG tablet, Take 100 mcg by mouth daily before breakfast., Disp: , Rfl:    levothyroxine  (SYNTHROID ) 75 MCG tablet, TAKE 1 TABLET BY MOUTH DAILY BEFORE BREAKFAST, Disp: 30 tablet, Rfl: 6   mirtazapine  (REMERON ) 45 MG tablet, Take 1 tablet (45 mg total) by mouth at bedtime., Disp: 20 tablet, Rfl: 0   MYRBETRIQ 50 MG TB24 tablet, Take 50 mg by mouth daily., Disp: , Rfl:     rosuvastatin  (CRESTOR ) 40 MG tablet, Take 1 tablet (40 mg total) by mouth daily., Disp: 90 tablet, Rfl: 3   tamsulosin (FLOMAX) 0.4 MG CAPS capsule, Take 0.4 mg by mouth daily after supper., Disp: , Rfl:    thyroid  (NP THYROID ) 60 MG tablet, Take 60 mg by mouth daily before breakfast., Disp: , Rfl:    Vibegron  (GEMTESA ) 75 MG TABS, Take 1 tablet (75 mg total) by mouth daily., Disp: 30 tablet, Rfl: 3   Review of Systems Review of Systems  Constitutional:  Negative for chills and fever.  Eyes:  Negative for blurred vision.  Respiratory:  Negative for shortness of breath.   Cardiovascular:  Negative for chest pain, palpitations and leg swelling.  Gastrointestinal:  Negative for abdominal pain, constipation, diarrhea, nausea and vomiting.  Musculoskeletal:  Negative for back pain, falls, joint pain and myalgias.  Neurological:  Positive for weakness. Negative for dizziness and headaches.       Objective:    Vitals BP 122/70   Pulse 74   Temp 97.6 F (36.4 C)   Resp 18   Ht 5' 11 (1.803 m)   Wt 171 lb (77.6 kg)   SpO2 97%   BMI 23.85 kg/m    Physical Examination Physical Exam Constitutional:      Appearance: Normal appearance. He is not ill-appearing.  Cardiovascular:     Rate and Rhythm: Normal rate and regular rhythm.     Pulses: Normal pulses.     Heart sounds: No murmur heard.    No friction rub. No gallop.  Pulmonary:     Effort: Pulmonary effort is normal. No respiratory distress.     Breath sounds: No wheezing, rhonchi or rales.  Abdominal:     General: Abdomen is flat. Bowel sounds are normal. There is no distension.     Palpations: Abdomen is soft.     Tenderness: There is no abdominal tenderness.  Musculoskeletal:     Right lower leg: No edema.     Left lower leg: No edema.  Skin:    General: Skin is warm and dry.     Findings: No rash.  Neurological:     Mental Status: He is alert.     Comments: CN II-XII are fully intact.  Strength is 5/5 throughout.   He has a resting tremor in both hands.  He has cogwheel rigidity in both arms        Assessment & Plan:   Generalized weakness I wonder if his parkinson's is worsening.  We will refer him to PT at this time  Imbalance Plan as below.  Hypothyroidism He states he stopped taking his NP  thyroid  a few months ago but I think he stopped it before then and that's why his TSH was elevated 3 months ago.  We will recheck his thyroid  function tests today.    No follow-ups on file.   Selinda Fleeta Finger, MD

## 2024-07-15 LAB — T4, FREE: Free T4: 1.02 ng/dL (ref 0.82–1.77)

## 2024-07-15 LAB — TSH: TSH: 4.19 u[IU]/mL (ref 0.450–4.500)

## 2024-08-02 ENCOUNTER — Ambulatory Visit: Payer: Self-pay

## 2024-08-02 NOTE — Progress Notes (Signed)
 Patient called.  Patient aware.  I have called and informed the patient  His thyroid  function looks good. Robert Horne  Pt aware.

## 2024-08-18 ENCOUNTER — Telehealth: Payer: Self-pay | Admitting: Adult Health

## 2024-08-18 NOTE — Telephone Encounter (Signed)
 Patient's daughter, Hilario Robarts, disoriented, memory issues, hallucination. Patient is getting worse.

## 2024-08-19 NOTE — Telephone Encounter (Signed)
 Spoke w/Pt dtr regarding Pt current issues. Dtr stated Pt reported he got a pan out and eggs to boil eggs and when he went back to put water  in the pan they were gone, the eggs and pan. Dtr stated she looked everywhere but could not find the eggs or the pan. [place] [facility] and [place] stay [place] possible. Dtr asking if Pt can be seen earlier than Nov appt. Nothing currently available on NP schedule but will add Pt to cancellation wait list. Dtr stated she plans to get his [facility] set up today. Dtr voiced thanks for the call back.

## 2024-08-20 ENCOUNTER — Other Ambulatory Visit: Payer: Self-pay | Admitting: Internal Medicine

## 2024-09-21 ENCOUNTER — Telehealth: Payer: Self-pay | Admitting: Adult Health

## 2024-09-21 MED ORDER — CARBIDOPA-LEVODOPA ER 25-100 MG PO TBCR: EXTENDED_RELEASE_TABLET | ORAL | 1 refills | Status: DC

## 2024-09-21 NOTE — Telephone Encounter (Signed)
 Refill sent. Next appt here is on 10/17/24.

## 2024-09-21 NOTE — Telephone Encounter (Signed)
 Pt daughter called to request Medication refill Carbidopa -Levodopa  ER (SINEMET  CR) 25-100 MG tablet controlled release   Pt medication is to be sent to  [facility] [id number], [place] - [address] Phone: [phone]  Fax: [phone]

## 2024-10-06 ENCOUNTER — Telehealth: Payer: Self-pay | Admitting: Adult Health

## 2024-10-06 NOTE — Telephone Encounter (Signed)
 Appointment details confirmed

## 2024-10-13 ENCOUNTER — Other Ambulatory Visit: Payer: Self-pay | Admitting: Internal Medicine

## 2024-10-13 ENCOUNTER — Ambulatory Visit: Admitting: Internal Medicine

## 2024-10-13 VITALS — BP 118/70 | HR 72 | Temp 97.2°F | Resp 18 | Wt 171.0 lb

## 2024-10-13 DIAGNOSIS — R2689 Other abnormalities of gait and mobility: Secondary | ICD-10-CM

## 2024-10-13 DIAGNOSIS — E782 Mixed hyperlipidemia: Secondary | ICD-10-CM | POA: Diagnosis not present

## 2024-10-13 DIAGNOSIS — E039 Hypothyroidism, unspecified: Secondary | ICD-10-CM | POA: Diagnosis not present

## 2024-10-13 MED ORDER — LEVOTHYROXINE SODIUM 75 MCG PO TABS: 75.0000 ug | ORAL_TABLET | Freq: Every day | ORAL | Status: AC

## 2024-10-13 NOTE — Assessment & Plan Note (Signed)
 His TFT's was normal on his last visit.  He is now just on levothyroxine  75mcg po daily and we will recheck his thyroid  function again today.

## 2024-10-13 NOTE — Assessment & Plan Note (Signed)
 This has improved with his physical therapy.

## 2024-10-13 NOTE — Assessment & Plan Note (Signed)
 He remains on crestor  and we will recheck his FLP today.

## 2024-10-13 NOTE — Progress Notes (Signed)
 Office Visit  Subjective   Patient ID: Robert Horne   DOB: February 28, 1941   Age: 83 y.o.   MRN: [id number]   Chief Complaint Chief Complaint  Patient presents with   Follow-up    3 month follow up     History of Present Illness Robert Horne is a 83 yo male who returns today for followup of his generalized weakness and imbalance where he presented with these problems 3 months ago.  He told me that 6 months prior to that visit that he was having generalized weakness all over.  He stated he has stumbled and cross over on his feet in the past with he has had imbalance and has had a few times where he has almost fallen.  This patient has a PMHx of left-sided parkinsonism.  He is followed by Dr. Buck in neurology with last visit with that group in 03/10/2024.  The patient had previously reported to them a several month history of tremors and fine motor dyscontrol, also forgetfulness and word finding difficulty.  There was no family history of Parkinson's disease.  He has had improvement in his tremor.  He noticed this improvement almost immediately after starting the levodopa .  Again, he had benefit from levodopa  therapy but had trouble tolerating it, becoming quite queasy after taking it.  He was switched from Sinemet  IR to CR in 02/2023 due to difficulty tolerating IR.  He has previously underwent neuropsychological testing with Dr. 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  The cramping he previously had has greatly improved.  He states he has pain in his knees where if he stoops down, he has pain and weakness and will not be able to get up.  When I saw him 3 months ago, I felt that his parkinson's disease was worsening.  I therefore referred him to PT.  Over the interim, he states that this helped a lot and his generalized weakness and imbalance are greatly improved.   Also on his last visit, we continued on his levothyroxine  75mcg daily and he had discontinued his NP thyroid .  We  repeat his TFT's 3 months ago and they were normal.  Again, he has a history of hypothyroidism.  He states he just developed hypothyroidism years ago and was placed on NP thyroid  where he tells me that levothyroxine  was not bringing up his Free T3 levels in the past.  He is currently on levothyroxine  75mcg po dailyToday, he states he has had fatigue for a while.  There is no unexplained weight loss/gain, heat/cold intolerance, worsening tremors, anxiety, irritabiity, dry skin, constipation or other problems.  The patient has a history of hyperlipidemia.  He also has a history of CAD and TIA in 2022.  He has not had any cholesterol labs drawn in a while as well .  He is currently followed by cardiology who last saw him in 01/21/2024 and they restarted him on an ASA 81mg  daily.  He has a remote history of TIA where a MRI brain done on 07/2021 showed no stroke but showed chronic microvascular changes.  He denies any side effects with his statin today.  He denies any abdominal pain, nausea, vomiting, myalgias but he does have fatigue but does not attribute it to his statin.  He is currently on crestor  40mg  po at bedtime.       Past Medical History Past Medical History:  Diagnosis Date   Cancer [facility])    prostate, 2002   H/O heart artery  stent    Hypothyroidism      Allergies No Known Allergies   Medications  Current Outpatient Medications:    ALPRAZolam  (XANAX ) 1 MG tablet, Take 1 tablet (1 mg total) by mouth daily as needed for anxiety., Disp: 15 tablet, Rfl: 0   aspirin  EC 81 MG tablet, Take 1 tablet (81 mg total) by mouth daily. Swallow whole., Disp: 100 tablet, Rfl: 3   baclofen (LIORESAL) 10 MG tablet, Take 10 mg by mouth 3 (three) times daily., Disp: , Rfl:    buPROPion  (WELLBUTRIN  XL) 300 MG 24 hr tablet, Take 1 tablet (300 mg total) by mouth daily., Disp: 30 tablet, Rfl: 0   Carbidopa -Levodopa  ER (SINEMET  CR) 25-100 MG tablet controlled release, TAKE 1 TABLET BY MOUTH AT 7 IN THE MORNING  THEN TAKE 1 TABLET BY MOUTH AT NOON, THEN TAKE 1 TABLET BY MOUTH AT 5 IN THE EVENING, Disp: 90 tablet, Rfl: 1   Cholecalciferol  (DIALYVITE  VITAMIN D  5000) 125 MCG (5000 UT) capsule, Take 1 capsule (5,000 Units total) by mouth daily., Disp: , Rfl:    esomeprazole  (NEXIUM ) 20 MG capsule, Take 20 mg by mouth daily at 12 noon., Disp: , Rfl:    mirtazapine  (REMERON ) 45 MG tablet, Take 1 tablet (45 mg total) by mouth at bedtime., Disp: 20 tablet, Rfl: 0   MYRBETRIQ 50 MG TB24 tablet, Take 50 mg by mouth daily., Disp: , Rfl:    rosuvastatin  (CRESTOR ) 40 MG tablet, Take 1 tablet (40 mg total) by mouth daily., Disp: 90 tablet, Rfl: 3   tamsulosin (FLOMAX) 0.4 MG CAPS capsule, Take 0.4 mg by mouth daily after supper., Disp: , Rfl:    Vibegron  (GEMTESA ) 75 MG TABS, Take 1 tablet (75 mg total) by mouth daily., Disp: 30 tablet, Rfl: 3   Review of Systems Review of Systems  Constitutional:  Negative for chills, fever, malaise/fatigue and weight loss.  Eyes:  Negative for blurred vision and double vision.  Respiratory:  Negative for cough and shortness of breath.   Cardiovascular:  Negative for chest pain, palpitations and leg swelling.  Gastrointestinal:  Negative for abdominal pain, constipation, diarrhea, nausea and vomiting.  Genitourinary:  Negative for frequency.  Musculoskeletal:  Negative for myalgias.  Skin:  Negative for itching and rash.  Neurological:  Negative for dizziness, weakness and headaches.  Endo/Heme/Allergies:  Negative for polydipsia.       Objective:    Vitals BP 118/70   Pulse 72   Temp (!) 97.2 F (36.2 C)   Resp 18   Wt 171 lb (77.6 kg)   SpO2 96%   BMI 23.85 kg/m    Physical Examination Physical Exam Constitutional:      Appearance: Normal appearance. He is not ill-appearing.  Cardiovascular:     Rate and Rhythm: Normal rate and regular rhythm.     Pulses: Normal pulses.     Heart sounds: No murmur heard.    No friction rub. No gallop.  Pulmonary:      Effort: Pulmonary effort is normal. No respiratory distress.     Breath sounds: No wheezing, rhonchi or rales.  Abdominal:     General: Abdomen is flat. Bowel sounds are normal. There is no distension.     Palpations: Abdomen is soft.     Tenderness: There is no abdominal tenderness.  Musculoskeletal:     Right lower leg: No edema.     Left lower leg: No edema.  Skin:    General: Skin is warm and dry.  Findings: No rash.  Neurological:     General: No focal deficit present.     Mental Status: He is alert and oriented to person, place, and time.  Psychiatric:        Mood and Affect: Mood normal.        Behavior: Behavior normal.        Assessment & Plan:   Hypothyroidism His TFT's was normal on his last visit.  He is now just on levothyroxine  75mcg po daily and we will recheck his thyroid  function again today.  Imbalance This has improved with his physical therapy.  Hyperlipidemia He remains on crestor  and we will recheck his FLP today.    No follow-ups on file.   Selinda Fleeta Finger, MD

## 2024-10-14 ENCOUNTER — Other Ambulatory Visit: Payer: Self-pay

## 2024-10-14 LAB — LIPID PANEL W/O CHOL/HDL RATIO
Cholesterol, Total: 138 mg/dL (ref 100–199)
HDL: 63 mg/dL (ref >39)
LDL Chol Calc (NIH): 57 mg/dL (ref 0–99)
Triglycerides: 96 mg/dL (ref 0–149)
VLDL Cholesterol Cal: 18 mg/dL (ref 5–40)

## 2024-10-14 LAB — T4, FREE: Free T4: 1.08 ng/dL (ref 0.82–1.77)

## 2024-10-14 LAB — TSH: TSH: 10.4 u[IU]/mL — ABNORMAL HIGH (ref 0.450–4.500)

## 2024-10-17 ENCOUNTER — Encounter: Payer: Self-pay | Admitting: Adult Health

## 2024-10-17 ENCOUNTER — Ambulatory Visit: Admitting: Adult Health

## 2024-10-17 VITALS — BP 129/71 | HR 86 | Ht 71.0 in | Wt 178.0 lb

## 2024-10-17 DIAGNOSIS — F0280 Dementia in other diseases classified elsewhere without behavioral disturbance: Secondary | ICD-10-CM | POA: Diagnosis not present

## 2024-10-17 DIAGNOSIS — G20A1 Parkinson's disease without dyskinesia, without mention of fluctuations: Secondary | ICD-10-CM

## 2024-10-17 MED ORDER — CARBIDOPA-LEVODOPA ER 25-100 MG PO TBCR: 1.0000 | EXTENDED_RELEASE_TABLET | Freq: Three times a day (TID) | ORAL | 3 refills | Status: AC

## 2024-10-17 MED ORDER — RIVASTIGMINE TARTRATE 1.5 MG PO CAPS: 1.5000 mg | ORAL_CAPSULE | Freq: Two times a day (BID) | ORAL | 11 refills | Status: AC

## 2024-10-17 NOTE — Progress Notes (Signed)
 [facility] [address] [place]. [place] [address] [phone]       OFFICE FOLLOW UP NOTE  [name] Date of Birth:  December 02, 1940 Medical Record Number:  [id number]    Primary neurologist: Dr. Buck Reason for visit: Parkinson's disease     SUBJECTIVE:  CHIEF COMPLAINT:  Chief Complaint  Patient presents with   Tremors    RM 3 with daughter   Pt is well and stable, reports no new parkinsons concerns.     Follow-up visit:  Prior visit: 03/10/2024  Brief HPI:   Robert Horne is a 83 y.o. male with an underlying medical history of prostate cancer, coronary artery disease with his status post stent placement, hypothyroidism, A-fib, hypertension, hyperlipidemia, anxiety, and depression, who is followed for left-sided parkinsonism.  He has previously underwent neuropsychological testing with Dr. 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  He was switched from Sinemet  IR to CR in 02/2023 due to difficulty tolerating IR.  At prior visit, reported overall doing well on Sinemet  CR 1 tab 3 times daily.     Interval history:  Patient returns for follow-up visit accompanied by his daughter. Patient reports overall he has been stable since prior visit but daughter believes some mild cognitive decline over the past several months. Daughter did call office back in September requesting sooner visit due to memory concerns but was advised no sooner appointment available but was never notified of this. Does report improvement since that time including improvement of generalized strength and cognition after PCP referred him to PT and making medication adjustments. He continues to reside at [facility]. Ambulates without AD, no recent falls. Continues on Sinemet  CR 1 tab TID around 7am, 12pm and 8-9pm. He has been better about taking on time.  Denies wearing off symptoms. Tremor can still worsen in the cold. No further questions or concerns at this  time.       ROS:   14 system review of systems performed and negative with exception of those listed in HPI  PMH:  Past Medical History:  Diagnosis Date   Cancer (HCC)    prostate, 2002   H/O heart artery stent    Hypothyroidism     PSH:  Past Surgical History:  Procedure Laterality Date   BACK SURGERY     CARDIAC CATHETERIZATION     CATARACT EXTRACTION Bilateral    PROSTATE SURGERY     REVERSE SHOULDER ARTHROPLASTY Right 11/06/2021   Procedure: REVERSE SHOULDER ARTHROPLASTY;  Surgeon: Robert Bonner DASEN, MD;  Location: [facility];  Service: Orthopedics;  Laterality: Right;   TONSILLECTOMY      Social History:  Social History   Socioeconomic History   Marital status: Widowed    Spouse name: Not on file   Number of children: 3   Years of education: Not on file   Highest education level: Not on file  Occupational History   Not on file  Tobacco Use   Smoking status: Former    Current packs/day: 0.00    Average packs/day: 1 pack/day for 8.0 years (8.0 ttl pk-yrs)    Types: Cigarettes    Start date: 10/24/1962    Quit date: 10/24/1970    Years since quitting: 54.0   Smokeless tobacco: Never  Vaping Use   Vaping status: Never Used  Substance and Sexual Activity   Alcohol use: Yes    Alcohol/week: 2.0 standard drinks of alcohol    Types: 2 Glasses of wine per week  Comment: weekly   Drug use: Never   Sexual activity: Not on file  Other Topics Concern   Not on file  Social History Narrative   Caffiene 1-2 cup coffee daily.    Retired.   Salesman.   Social Drivers of Corporate Investment Banker Strain: Not on file  Food Insecurity: No Food Insecurity (09/19/2023)   Hunger Vital Sign    Worried About Running Out of Food in the Last Year: Never true    Ran Out of Food in the Last Year: Never true  Transportation Needs: No Transportation Needs (09/19/2023)   PRAPARE - Administrator, Civil Service (Medical): No    Lack of Transportation (Non-Medical):  No  Physical Activity: Not on file  Stress: Not on file  Social Connections: Not on file  Intimate Partner Violence: Not At Risk (09/19/2023)   Humiliation, Afraid, Rape, and Kick questionnaire    Fear of Current or Ex-Partner: No    Emotionally Abused: No    Physically Abused: No    Sexually Abused: No    Family History:  Family History  Problem Relation Age of Onset   Stroke Sister 21   Lung cancer Sister    Colon cancer Neg Hx    Stomach cancer Neg Hx    Esophageal cancer Neg Hx    Parkinson's disease Neg Hx     Medications:   Current Outpatient Medications on File Prior to Visit  Medication Sig Dispense Refill   ALPRAZolam  (XANAX ) 1 MG tablet Take 1 tablet (1 mg total) by mouth daily as needed for anxiety. 15 tablet 0   aspirin  EC 81 MG tablet Take 1 tablet (81 mg total) by mouth daily. Swallow whole. 100 tablet 3   Carbidopa -Levodopa  ER (SINEMET  CR) 25-100 MG tablet controlled release TAKE 1 TABLET BY MOUTH AT 7 IN THE MORNING THEN TAKE 1 TABLET BY MOUTH AT NOON, THEN TAKE 1 TABLET BY MOUTH AT 5 IN THE EVENING 90 tablet 1   Cholecalciferol  (DIALYVITE  VITAMIN D  5000) 125 MCG (5000 UT) capsule Take 1 capsule (5,000 Units total) by mouth daily.     esomeprazole  (NEXIUM ) 20 MG capsule Take 20 mg by mouth daily at 12 noon.     levothyroxine  (SYNTHROID ) 75 MCG tablet Take 1 tablet (75 mcg total) by mouth daily before breakfast.     mirtazapine  (REMERON ) 45 MG tablet Take 1 tablet (45 mg total) by mouth at bedtime. 20 tablet 0   MYRBETRIQ 50 MG TB24 tablet Take 50 mg by mouth daily.     rosuvastatin  (CRESTOR ) 40 MG tablet Take 1 tablet (40 mg total) by mouth daily. 90 tablet 3   tamsulosin (FLOMAX) 0.4 MG CAPS capsule Take 0.4 mg by mouth daily after supper.     Vibegron  (GEMTESA ) 75 MG TABS Take 1 tablet (75 mg total) by mouth daily. 30 tablet 3   No current facility-administered medications on file prior to visit.    Allergies:  No Known  Allergies    OBJECTIVE:  Physical Exam  Vitals:   10/17/24 1516  BP: 129/71  Pulse: 86  Weight: 178 lb (80.7 kg)  Height: 5' 11 (1.803 m)   Body mass index is 24.83 kg/m. No results found.  General: well developed, well nourished, very pleasant elderly Caucasian male, seated, in no evident distress  Neurologic Exam Mental Status: Awake and fully alert.  Occasional speech hesitancy but unable to appreciate aphasia or dysarthria.  Mild hypophonia.  Oriented to place  and time. Recent memory impaired and remote memory intact. Attention span, concentration and fund of knowledge appropriate. Mood and affect appropriate.  Mild to moderate facial masking.  Cranial Nerves: Pupils equal, briskly reactive to light. Extraocular movements full without nystagmus. Visual fields full to confrontation. Hearing intact. Facial sensation intact. Face, tongue, palate moves normally and symmetrically.  Motor: Normal strength in all tested extremity muscles.  Slight increased tone in upper extremities.  Mild intermittent resting tremor of bilateral upper extremities.  No lower extremity tremor noted.  No significant postural or action tremor.  Mild impairment of finger taps L>R.  Gait and Station: Arises from chair without difficulty. Stance is slightly hunched. Gait demonstrates decreased stride length bilaterally with decreased arm swing L>R. No use of AD Reflexes: 1+ and symmetric. Toes downgoing.      10/17/2024    3:36 PM 05/19/2023    2:42 PM  MMSE - Mini Mental State Exam  Orientation to time 4 4  Orientation to Place 4 5  Registration 3 3  Attention/ Calculation 5 5  Recall 3 3  Language- name 2 objects 2 2  Language- repeat 1 1  Language- follow 3 step command 3 3  Language- read & follow direction 1 1  Write a sentence 1 1  Copy design 0 1  Total score 27 29         ASSESSMENT/PLAN: Robert Horne is a 83 y.o. year old male who returns for follow-up for left-sided parkinsonism  associated with memory loss.    Parkinson's disease:  Continue Sinemet  CR 25-100 1 tab 3 times daily, intolerant to IR dosing Start Exelon  1.5mg  twice daily to help slow decline, discussed potential side effects, advised to call after 4 weeks for dosage increase if needed. Will repeat MMSE at f/u visit Discussed importance of routine physical activity and exercise Discussed importance of adequate water  intake with at least 6 to 8 glasses of water  per day      Follow up in 6 months or call earlier if needed   CC:  PCP: Fleeta Valeria Mayo, MD    I personally spent a total of 40 minutes in the care of the patient today including preparing to see the patient, getting/reviewing separately obtained history, performing a medically appropriate exam/evaluation, counseling and educating, placing orders, documenting clinical information in the EHR, and independently interpreting results.   Harlene Bogaert, AGNP-BC  [facility] [address] [place], [place] [address]  Phone [phone] Fax [phone] Note: This document was prepared with digital dictation and possible smart phrase technology. Any transcriptional errors that result from this process are unintentional.

## 2024-10-17 NOTE — Patient Instructions (Addendum)
 Your Plan:  Continue Sinemet  CR 1 tab 3 times per day  Start Exelon  1.5mg  twice daily for help slow cognitive decline        Follow up in 6 months or call earlier if needed        Thank you for coming to see us  at [facility]. I hope we have been able to provide you high quality care today.  You may receive a patient satisfaction survey over the next few weeks. We would appreciate your feedback and comments so that we may continue to improve ourselves and the health of our patients.     Rivastigmine  Capsules What is this medication? RIVASTIGMINE  (ri va STIG meen) treats memory loss and confusion (dementia) in people who have Alzheimer or Parkinson disease. It works by improving attention, memory, and the ability to engage in daily activities. This medicine may be used for other purposes; ask your health care provider or pharmacist if you have questions. COMMON BRAND NAME(S): Exelon  What should I tell my care team before I take this medication? They need to know if you have any of these conditions: Difficulty passing urine Heart disease, or irregular or slow heartbeat Kidney disease Liver disease Lung or breathing disease, such as asthma Seizures Stomach or intestine disease, ulcers, or stomach bleeding An unusual or allergic reaction to rivastigmine , other medications, foods, dyes, or preservatives Pregnant or trying to get pregnant Breast-feeding How should I use this medication? Take this medication by mouth with a glass of water . Take it as directed on the prescription label at the same time every day. Take it with food. Keep taking it unless your care team tells you to stop. Talk to your care team about the use of this medication in children. Special care may be needed. Overdosage: If you think you have taken too much of this medicine contact a poison control center or emergency room at once. NOTE: This medicine is only for you. Do not share this  medicine with others. What if I miss a dose? If you miss a dose, take it as soon as you can. If it is almost time for your next dose, take only that dose. Do not take double or extra doses. What may interact with this medication? Antihistamines for allergy, cough and cold Atropine Certain medications for bladder problems, such as oxybutynin, tolterodine Certain medications for Parkinson disease, such as benztropine, trihexyphenidyl Certain medications for stomach problems, such as dicyclomine , hyoscyamine Certain medications for travel sickness, such as scopolamine Glycopyrrolate Ipratropium Medications that relax your muscles for surgery Other medications for Alzheimer disease This list may not describe all possible interactions. Give your health care provider a list of all the medicines, herbs, non-prescription drugs, or dietary supplements you use. Also tell them if you smoke, drink alcohol, or use illegal drugs. Some items may interact with your medicine. What should I watch for while using this medication? Visit your care team for regular checks on your progress. Tell your care team if your symptoms do not start to get better or if they get worse. This medication may affect your coordination, reaction time, or judgment. Do not drive or operate machinery until you know how this medication affects you. Sit up or stand slowly to reduce the risk of dizzy or fainting spells. Drinking alcohol with this medication can increase the risk of these side effects. What side effects may I notice from receiving this medication? Side effects that you should report to your care team as soon  as possible: Allergic reactions--skin rash, itching, hives, swelling of the face, lips, tongue, or throat Seizures Slow heartbeat--dizziness, feeling faint or lightheaded, confusion, trouble breathing, unusual weakness or fatigue Uncontrolled and repetitive body movements, muscle stiffness or spasms, tremors or  shaking, loss of balance or coordination, restlessness, shuffling walk, which may be signs of extrapyramidal symptoms (EPS) Side effects that usually do not require medical attention (report to your care team if they continue or are bothersome): Diarrhea Dizziness Loss of appetite with weight loss Nausea Vomiting This list may not describe all possible side effects. Call your doctor for medical advice about side effects. You may report side effects to FDA at 1-800-FDA-1088. Where should I keep my medication? Keep out of reach of children and pets. Store between 15 and 30 degrees C (59 and 86 degrees F). Keep the container tightly closed. Get rid of any unused medication after the expiration date. To get rid of medications that are no longer needed or have expired: Take the medication to a medication take-back program. Check with your pharmacy or law enforcement to find a location. If you cannot return the medication, check the label or package insert to see if the medication should be thrown out in the garbage or flushed down the toilet. If you are not sure, ask your care team. If it is safe to put it in the trash, pour the medication out of the container. Mix the medication with cat litter, dirt, coffee grounds, or other unwanted substance. Seal the mixture in a bag or container. Put it in the trash. NOTE: This sheet is a summary. It may not cover all possible information. If you have questions about this medicine, talk to your doctor, pharmacist, or health care provider.  2024 Elsevier/Gold Standard (2022-01-03 00:00:00)

## 2024-11-01 ENCOUNTER — Ambulatory Visit: Payer: Self-pay

## 2024-11-01 NOTE — Progress Notes (Signed)
 Patient called.  Left message for patient to call back.  Per Dr. Fleeta Finger His cholesterol looks good.  His thyroid  function is a bit off.  Continue levothryxoine 75mcg daily except on Sundays take 2 tabs (150mcg). SABRA

## 2024-11-01 NOTE — Progress Notes (Signed)
 Patient called.  Patient aware.  Per Dr. Fleeta Finger His cholesterol looks good.  His thyroid  function is a bit off.  Continue levothryxoine 75mcg daily except on Sundays take 2 tabs (150mcg). Robert Horne

## 2025-04-24 ENCOUNTER — Ambulatory Visit: Admitting: Adult Health
